# Patient Record
Sex: Female | Born: 1960 | Race: White | Hispanic: No | Marital: Single | Smoking: Never smoker
Health system: Southern US, Community
[De-identification: ages and names within clinical notes are randomized; demographics above are authoritative.]

## PROBLEM LIST (undated history)

## (undated) DIAGNOSIS — G43909 Migraine, unspecified, not intractable, without status migrainosus: Secondary | ICD-10-CM

## (undated) DIAGNOSIS — E785 Hyperlipidemia, unspecified: Secondary | ICD-10-CM

## (undated) DIAGNOSIS — Z91148 Patient's other noncompliance with medication regimen for other reason: Secondary | ICD-10-CM

## (undated) DIAGNOSIS — F32A Depression, unspecified: Secondary | ICD-10-CM

## (undated) DIAGNOSIS — F419 Anxiety disorder, unspecified: Secondary | ICD-10-CM

## (undated) DIAGNOSIS — F329 Major depressive disorder, single episode, unspecified: Secondary | ICD-10-CM

## (undated) DIAGNOSIS — Z9114 Patient's other noncompliance with medication regimen: Secondary | ICD-10-CM

## (undated) DIAGNOSIS — K219 Gastro-esophageal reflux disease without esophagitis: Secondary | ICD-10-CM

---

## 1995-03-07 HISTORY — PX: TUBAL LIGATION: SHX77

## 2002-03-06 HISTORY — PX: AUGMENTATION MAMMAPLASTY: SUR837

## 2006-05-07 ENCOUNTER — Ambulatory Visit: Payer: Self-pay | Admitting: Gastroenterology

## 2013-06-23 DIAGNOSIS — F4323 Adjustment disorder with mixed anxiety and depressed mood: Secondary | ICD-10-CM | POA: Insufficient documentation

## 2013-06-23 DIAGNOSIS — G43909 Migraine, unspecified, not intractable, without status migrainosus: Secondary | ICD-10-CM | POA: Insufficient documentation

## 2014-07-03 ENCOUNTER — Ambulatory Visit
Admit: 2014-07-03 | Disposition: A | Discharge status: Home / Self Care | Payer: Self-pay | Attending: Surgery | Admitting: Surgery

## 2015-06-17 DIAGNOSIS — E782 Mixed hyperlipidemia: Secondary | ICD-10-CM | POA: Insufficient documentation

## 2015-12-22 ENCOUNTER — Other Ambulatory Visit: Payer: Self-pay | Admitting: Internal Medicine

## 2015-12-22 DIAGNOSIS — N644 Mastodynia: Secondary | ICD-10-CM

## 2015-12-28 ENCOUNTER — Other Ambulatory Visit: Payer: Self-pay | Admitting: *Deleted

## 2015-12-28 ENCOUNTER — Ambulatory Visit
Admission: RE | Admit: 2015-12-28 | Discharge: 2015-12-28 | Disposition: A | Discharge status: Home / Self Care | Payer: Self-pay | Source: Ambulatory Visit | Attending: *Deleted | Admitting: *Deleted

## 2015-12-28 DIAGNOSIS — Z9289 Personal history of other medical treatment: Secondary | ICD-10-CM

## 2016-01-14 ENCOUNTER — Ambulatory Visit
Admission: RE | Admit: 2016-01-14 | Discharge: 2016-01-14 | Disposition: A | Discharge status: Home / Self Care | Payer: Managed Care, Other (non HMO) | Source: Ambulatory Visit | Attending: Internal Medicine | Admitting: Internal Medicine

## 2016-01-14 ENCOUNTER — Other Ambulatory Visit: Payer: Self-pay | Admitting: Internal Medicine

## 2016-01-14 DIAGNOSIS — Z9882 Breast implant status: Secondary | ICD-10-CM | POA: Diagnosis not present

## 2016-01-14 DIAGNOSIS — N644 Mastodynia: Secondary | ICD-10-CM | POA: Insufficient documentation

## 2016-01-14 DIAGNOSIS — R921 Mammographic calcification found on diagnostic imaging of breast: Secondary | ICD-10-CM | POA: Diagnosis not present

## 2016-01-18 ENCOUNTER — Other Ambulatory Visit: Payer: Self-pay | Admitting: Internal Medicine

## 2016-01-18 DIAGNOSIS — R921 Mammographic calcification found on diagnostic imaging of breast: Secondary | ICD-10-CM

## 2016-02-01 ENCOUNTER — Ambulatory Visit
Admission: RE | Admit: 2016-02-01 | Discharge: 2016-02-01 | Disposition: A | Discharge status: Home / Self Care | Payer: Managed Care, Other (non HMO) | Source: Ambulatory Visit | Attending: Internal Medicine | Admitting: Internal Medicine

## 2016-02-01 DIAGNOSIS — N6011 Diffuse cystic mastopathy of right breast: Secondary | ICD-10-CM | POA: Insufficient documentation

## 2016-02-01 DIAGNOSIS — R920 Mammographic microcalcification found on diagnostic imaging of breast: Secondary | ICD-10-CM | POA: Insufficient documentation

## 2016-02-01 DIAGNOSIS — R921 Mammographic calcification found on diagnostic imaging of breast: Secondary | ICD-10-CM

## 2016-02-02 LAB — SURGICAL PATHOLOGY

## 2016-02-04 HISTORY — PX: BREAST CYST EXCISION: SHX579

## 2016-12-22 DIAGNOSIS — E538 Deficiency of other specified B group vitamins: Secondary | ICD-10-CM | POA: Insufficient documentation

## 2017-01-11 ENCOUNTER — Other Ambulatory Visit: Payer: Self-pay | Admitting: Internal Medicine

## 2017-01-11 DIAGNOSIS — Z1231 Encounter for screening mammogram for malignant neoplasm of breast: Secondary | ICD-10-CM

## 2017-02-01 ENCOUNTER — Ambulatory Visit
Admission: RE | Admit: 2017-02-01 | Discharge: 2017-02-01 | Disposition: A | Discharge status: Home / Self Care | Payer: 59 | Source: Ambulatory Visit | Attending: Internal Medicine | Admitting: Internal Medicine

## 2017-02-01 DIAGNOSIS — Z1231 Encounter for screening mammogram for malignant neoplasm of breast: Secondary | ICD-10-CM | POA: Insufficient documentation

## 2017-04-03 ENCOUNTER — Emergency Department
Admission: EM | Admit: 2017-04-03 | Discharge: 2017-04-03 | Discharge status: Against Medical Advice | Payer: 59 | Attending: Emergency Medicine | Admitting: Emergency Medicine

## 2017-04-03 ENCOUNTER — Encounter: Payer: Self-pay | Admitting: Emergency Medicine

## 2017-04-03 ENCOUNTER — Emergency Department: Payer: 59

## 2017-04-03 ENCOUNTER — Other Ambulatory Visit: Payer: 59

## 2017-04-03 ENCOUNTER — Other Ambulatory Visit: Payer: Self-pay

## 2017-04-03 DIAGNOSIS — R270 Ataxia, unspecified: Secondary | ICD-10-CM | POA: Diagnosis not present

## 2017-04-03 DIAGNOSIS — R400 Somnolence: Secondary | ICD-10-CM | POA: Insufficient documentation

## 2017-04-03 DIAGNOSIS — R410 Disorientation, unspecified: Secondary | ICD-10-CM

## 2017-04-03 HISTORY — DX: Depression, unspecified: F32.A

## 2017-04-03 HISTORY — DX: Major depressive disorder, single episode, unspecified: F32.9

## 2017-04-03 LAB — COMPREHENSIVE METABOLIC PANEL
ALBUMIN: 4.6 g/dL (ref 3.5–5.0)
ALT: 16 U/L (ref 14–54)
ANION GAP: 8 (ref 5–15)
AST: 22 U/L (ref 15–41)
Alkaline Phosphatase: 62 U/L (ref 38–126)
BUN: 11 mg/dL (ref 6–20)
CO2: 28 mmol/L (ref 22–32)
Calcium: 9.1 mg/dL (ref 8.9–10.3)
Chloride: 103 mmol/L (ref 101–111)
Creatinine, Ser: 0.88 mg/dL (ref 0.44–1.00)
GFR calc Af Amer: >60 mL/min (ref >60)
GLUCOSE: 115 mg/dL — AB (ref 65–99)
POTASSIUM: 3.6 mmol/L (ref 3.5–5.1)
Sodium: 139 mmol/L (ref 135–145)
TOTAL PROTEIN: 7.3 g/dL (ref 6.5–8.1)
Total Bilirubin: 0.7 mg/dL (ref 0.3–1.2)

## 2017-04-03 LAB — CBC WITH DIFFERENTIAL/PLATELET
BASOS PCT: 1 %
Basophils Absolute: 0 10*3/uL (ref 0–0.1)
EOS PCT: 3 %
Eosinophils Absolute: 0.1 10*3/uL (ref 0–0.7)
HCT: 39.7 % (ref 35.0–47.0)
Hemoglobin: 13.4 g/dL (ref 12.0–16.0)
Lymphocytes Relative: 26 %
Lymphs Abs: 1.1 10*3/uL (ref 1.0–3.6)
MCH: 29.2 pg (ref 26.0–34.0)
MCHC: 33.6 g/dL (ref 32.0–36.0)
MCV: 86.9 fL (ref 80.0–100.0)
MONO ABS: 0.3 10*3/uL (ref 0.2–0.9)
Monocytes Relative: 6 %
NEUTROS ABS: 2.8 10*3/uL (ref 1.4–6.5)
Neutrophils Relative %: 64 %
Platelets: 179 10*3/uL (ref 150–440)
RBC: 4.57 MIL/uL (ref 3.80–5.20)
RDW: 13.4 % (ref 11.5–14.5)
WBC: 4.4 10*3/uL (ref 3.6–11.0)

## 2017-04-03 LAB — TROPONIN I

## 2017-04-03 LAB — ETHANOL: Alcohol, Ethyl (B): <10 mg/dL (ref <10)

## 2017-04-03 NOTE — ED Notes (Signed)
Pt signed hardcopy of AMA and it is in pt chart.

## 2017-04-03 NOTE — ED Notes (Signed)
FN: pt sent over from [facility] for further eval of headache from a fall last week.

## 2017-04-03 NOTE — ED Notes (Signed)
FN: pt called to go back to a hallway bed and did not answer.
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
## 2017-12-27 ENCOUNTER — Other Ambulatory Visit: Payer: Self-pay | Admitting: Internal Medicine

## 2017-12-27 DIAGNOSIS — Z1231 Encounter for screening mammogram for malignant neoplasm of breast: Secondary | ICD-10-CM

## 2018-02-05 ENCOUNTER — Ambulatory Visit
Admission: RE | Admit: 2018-02-05 | Discharge: 2018-02-05 | Disposition: A | Discharge status: Home / Self Care | Payer: Managed Care, Other (non HMO) | Source: Ambulatory Visit | Attending: Internal Medicine | Admitting: Internal Medicine

## 2018-02-05 DIAGNOSIS — Z1231 Encounter for screening mammogram for malignant neoplasm of breast: Secondary | ICD-10-CM | POA: Diagnosis present

## 2018-08-31 ENCOUNTER — Inpatient Hospital Stay
Admission: EM | Admit: 2018-08-31 | Discharge: 2018-09-03 | DRG: 917 | Disposition: A | Discharge status: Other Acute Inpatient Hospital | Payer: Managed Care, Other (non HMO) | Attending: Internal Medicine | Admitting: Internal Medicine

## 2018-08-31 ENCOUNTER — Encounter: Payer: Self-pay | Admitting: Emergency Medicine

## 2018-08-31 ENCOUNTER — Other Ambulatory Visit: Payer: Self-pay

## 2018-08-31 DIAGNOSIS — G4700 Insomnia, unspecified: Secondary | ICD-10-CM | POA: Diagnosis present

## 2018-08-31 DIAGNOSIS — T426X2A Poisoning by other antiepileptic and sedative-hypnotic drugs, intentional self-harm, initial encounter: Principal | ICD-10-CM | POA: Diagnosis present

## 2018-08-31 DIAGNOSIS — Z79899 Other long term (current) drug therapy: Secondary | ICD-10-CM

## 2018-08-31 DIAGNOSIS — G92 Toxic encephalopathy: Secondary | ICD-10-CM | POA: Diagnosis present

## 2018-08-31 DIAGNOSIS — T50902A Poisoning by unspecified drugs, medicaments and biological substances, intentional self-harm, initial encounter: Secondary | ICD-10-CM

## 2018-08-31 DIAGNOSIS — E876 Hypokalemia: Secondary | ICD-10-CM | POA: Diagnosis present

## 2018-08-31 DIAGNOSIS — T50901A Poisoning by unspecified drugs, medicaments and biological substances, accidental (unintentional), initial encounter: Secondary | ICD-10-CM | POA: Diagnosis present

## 2018-08-31 DIAGNOSIS — R9431 Abnormal electrocardiogram [ECG] [EKG]: Secondary | ICD-10-CM

## 2018-08-31 DIAGNOSIS — R45851 Suicidal ideations: Secondary | ICD-10-CM | POA: Diagnosis present

## 2018-08-31 DIAGNOSIS — T39392A Poisoning by other nonsteroidal anti-inflammatory drugs [NSAID], intentional self-harm, initial encounter: Secondary | ICD-10-CM | POA: Diagnosis present

## 2018-08-31 DIAGNOSIS — R4182 Altered mental status, unspecified: Secondary | ICD-10-CM | POA: Diagnosis not present

## 2018-08-31 DIAGNOSIS — Z1159 Encounter for screening for other viral diseases: Secondary | ICD-10-CM | POA: Diagnosis not present

## 2018-08-31 DIAGNOSIS — T43212A Poisoning by selective serotonin and norepinephrine reuptake inhibitors, intentional self-harm, initial encounter: Secondary | ICD-10-CM | POA: Diagnosis present

## 2018-08-31 DIAGNOSIS — F332 Major depressive disorder, recurrent severe without psychotic features: Secondary | ICD-10-CM | POA: Diagnosis present

## 2018-08-31 DIAGNOSIS — T391X2A Poisoning by 4-Aminophenol derivatives, intentional self-harm, initial encounter: Secondary | ICD-10-CM | POA: Diagnosis present

## 2018-08-31 HISTORY — DX: Suicidal ideations: R45.851

## 2018-08-31 LAB — PROTIME-INR
INR: 0.9 (ref 0.8–1.2)
Prothrombin Time: 12.3 seconds (ref 11.4–15.2)

## 2018-08-31 LAB — COMPREHENSIVE METABOLIC PANEL
ALT: 80 U/L — ABNORMAL HIGH (ref 0–44)
AST: 94 U/L — ABNORMAL HIGH (ref 15–41)
Albumin: 3.9 g/dL (ref 3.5–5.0)
Alkaline Phosphatase: 72 U/L (ref 38–126)
Anion gap: 9 (ref 5–15)
BUN: 14 mg/dL (ref 6–20)
CO2: 23 mmol/L (ref 22–32)
Calcium: 8.9 mg/dL (ref 8.9–10.3)
Chloride: 106 mmol/L (ref 98–111)
Creatinine, Ser: 0.91 mg/dL (ref 0.44–1.00)
GFR calc Af Amer: >60 mL/min (ref >60)
GFR calc non Af Amer: >60 mL/min (ref >60)
Glucose, Bld: 109 mg/dL — ABNORMAL HIGH (ref 70–99)
Potassium: 3.4 mmol/L — ABNORMAL LOW (ref 3.5–5.1)
Sodium: 138 mmol/L (ref 135–145)
Total Bilirubin: 1.7 mg/dL — ABNORMAL HIGH (ref 0.3–1.2)
Total Protein: 6.8 g/dL (ref 6.5–8.1)

## 2018-08-31 LAB — CBC
HCT: 37.4 % (ref 36.0–46.0)
Hemoglobin: 12.3 g/dL (ref 12.0–15.0)
MCH: 29.5 pg (ref 26.0–34.0)
MCHC: 32.9 g/dL (ref 30.0–36.0)
MCV: 89.7 fL (ref 80.0–100.0)
Platelets: 173 10*3/uL (ref 150–400)
RBC: 4.17 MIL/uL (ref 3.87–5.11)
RDW: 13.3 % (ref 11.5–15.5)
WBC: 3.9 10*3/uL — ABNORMAL LOW (ref 4.0–10.5)
nRBC: 0 % (ref 0.0–0.2)

## 2018-08-31 LAB — ACETAMINOPHEN LEVEL
Acetaminophen (Tylenol), Serum: 75 ug/mL — ABNORMAL HIGH (ref 10–30)
Acetaminophen (Tylenol), Serum: 56 ug/mL — ABNORMAL HIGH (ref 10–30)

## 2018-08-31 LAB — URINE DRUG SCREEN, QUALITATIVE (ARMC ONLY)
Amphetamines, Ur Screen: NOT DETECTED
Barbiturates, Ur Screen: NOT DETECTED
Benzodiazepine, Ur Scrn: NOT DETECTED
Cannabinoid 50 Ng, Ur ~~LOC~~: NOT DETECTED
Cocaine Metabolite,Ur ~~LOC~~: NOT DETECTED
MDMA (Ecstasy)Ur Screen: NOT DETECTED
Methadone Scn, Ur: NOT DETECTED
Opiate, Ur Screen: NOT DETECTED
Phencyclidine (PCP) Ur S: NOT DETECTED
Tricyclic, Ur Screen: NOT DETECTED

## 2018-08-31 LAB — MAGNESIUM: Magnesium: 2.5 mg/dL — ABNORMAL HIGH (ref 1.7–2.4)

## 2018-08-31 LAB — SALICYLATE LEVEL: Salicylate Lvl: <7 mg/dL (ref 2.8–30.0)

## 2018-08-31 LAB — SARS CORONAVIRUS 2 BY RT PCR (HOSPITAL ORDER, PERFORMED IN ~~LOC~~ HOSPITAL LAB): SARS Coronavirus 2: NEGATIVE

## 2018-08-31 LAB — ETHANOL: Alcohol, Ethyl (B): <10 mg/dL (ref <10)

## 2018-08-31 MED ORDER — MAGNESIUM SULFATE 2 GM/50ML IV SOLN: 2.0000 g | Freq: Once | INTRAVENOUS | Status: DC

## 2018-08-31 MED ORDER — POTASSIUM CHLORIDE CRYS ER 20 MEQ PO TBCR
40.0000 meq | EXTENDED_RELEASE_TABLET | Freq: Once | ORAL | Status: AC
  Administered 2018-08-31: 40 meq via ORAL
  Filled 2018-08-31: qty 2

## 2018-08-31 MED ORDER — BISACODYL 5 MG PO TBEC: 5.0000 mg | DELAYED_RELEASE_TABLET | Freq: Every day | ORAL | Status: DC | PRN

## 2018-08-31 MED ORDER — ENOXAPARIN SODIUM 40 MG/0.4ML ~~LOC~~ SOLN
40.0000 mg | SUBCUTANEOUS | Status: DC
  Administered 2018-08-31 – 2018-09-02 (×3): 40 mg via SUBCUTANEOUS
  Filled 2018-08-31 (×3): qty 0.4

## 2018-08-31 MED ORDER — SENNOSIDES-DOCUSATE SODIUM 8.6-50 MG PO TABS: 1.0000 | ORAL_TABLET | Freq: Every evening | ORAL | Status: DC | PRN

## 2018-08-31 MED ORDER — SODIUM CHLORIDE 0.9 % IV BOLUS
500.0000 mL | Freq: Once | INTRAVENOUS | Status: AC
  Administered 2018-08-31: 500 mL via INTRAVENOUS

## 2018-08-31 MED ORDER — SODIUM CHLORIDE 0.9 % IV SOLN
Freq: Once | INTRAVENOUS | Status: AC
  Administered 2018-08-31: 15:00:00 via INTRAVENOUS

## 2018-08-31 MED ORDER — ACETAMINOPHEN 650 MG RE SUPP: 650.0000 mg | Freq: Four times a day (QID) | RECTAL | Status: DC | PRN

## 2018-08-31 MED ORDER — POTASSIUM CHLORIDE IN NACL 20-0.9 MEQ/L-% IV SOLN
INTRAVENOUS | Status: DC
  Administered 2018-08-31 – 2018-09-01 (×2): via INTRAVENOUS
  Filled 2018-08-31 (×6): qty 1000

## 2018-08-31 MED ORDER — ACETAMINOPHEN 325 MG PO TABS: 650.0000 mg | ORAL_TABLET | Freq: Four times a day (QID) | ORAL | Status: DC | PRN

## 2018-08-31 NOTE — Progress Notes (Signed)
Patient very drowsy, let her know I would come see her tomorrow.  Too drowsy to assess at this time, will see her tomorrow on the floor.  Waylan Boga, PMHNP

## 2018-08-31 NOTE — ED Notes (Signed)
Pt states overdosed and wanted to hurt herself d/t being depressed over her son. Pt is speaking very softly but said something about her son wanting nothing to do with her.

## 2018-08-31 NOTE — ED Triage Notes (Signed)
Pt to ER via EMS with reports of possible overdose.  Family found pt mildly unresponsive, has hx of drug overdose.  Pt possibly took 3 effexor, 10 ambien, 6 valtrex, and 15 etodolac.  Pt admitted purposeful intent to EMS.

## 2018-08-31 NOTE — ED Notes (Signed)
Pt ivc'd with officer at the doorway. Pt was changed into scrubs on arrival. Pt had on shorts and tshirt - no undergarments. Had on two rings with are in her clothing bag. Cell phone and empty medication bottles are in the bag.

## 2018-08-31 NOTE — ED Notes (Signed)
Patient is resting comfortably. 

## 2018-08-31 NOTE — ED Notes (Signed)
Pt is on an external urinary catheter - no urine at this time. Fluids are infusing

## 2018-08-31 NOTE — Progress Notes (Signed)
   08/31/18 1800  Clinical Encounter Type  Visited With Patient not available;Other (Comment)  Visit Type Initial  Referral From Nurse  Consult/Referral To Chaplain  Spiritual Encounters  Spiritual Needs Other (Comment)  Chaplain received OR for AD. Chaplain went to visit patient was asleep nurse stated patient was to drowsy at this time. Chaplain will tell next Chaplain on call to follow up.

## 2018-08-31 NOTE — H&P (Signed)
[facility] [place] [facility]   PATIENT NAME: Ann GillsCathy Jimenez    MR#:  [id number]  DATE OF BIRTH:  [birth year]  DATE OF ADMISSION:  08/31/2018  PRIMARY CARE PHYSICIAN: Danella PentonMiller, Mark F, MD   REQUESTING/REFERRING PHYSICIAN: Dr. Roxan Hockeyobinson.  CHIEF COMPLAINT:   Chief Complaint  Patient presents with  . Drug Overdose   Drug overdose. HISTORY OF PRESENT ILLNESS:  Ann Jimenez  is a 58 y.o. female with a known history of depression.  The patient is sent to the ED due to intentional drug overdose.  The patient attempted to suicide and ingested 3 Effexor, 10 Ambien, 6 Valtrex and 15 etodolac.  She is drowsy and not a good historian. PAST MEDICAL HISTORY:   Past Medical History:  Diagnosis Date  . Depression     PAST SURGICAL HISTORY:   Past Surgical History:  Procedure Laterality Date  . AUGMENTATION MAMMAPLASTY  2004  . BREAST CYST EXCISION  02/2016    SOCIAL HISTORY:   Social History   Tobacco Use  . Smoking status: Never Smoker  . Smokeless tobacco: Never Used  Substance Use Topics  . Alcohol use: No    Frequency: Never    FAMILY HISTORY:   Family History  Problem Relation Age of Onset  . Breast cancer Mother 4480    DRUG ALLERGIES:  No Known Allergies  REVIEW OF SYSTEMS:   Review of Systems  Unable to perform ROS: Mental status change    MEDICATIONS AT HOME:   Prior to Admission medications   Medication Sig Start Date End Date Taking? Authorizing Provider  cyclobenzaprine (FLEXERIL) 10 MG tablet Take 10 mg by mouth 2 (two) times daily as needed for muscle spasms. 06/18/18  Yes [provider]  etodolac (LODINE) 400 MG tablet Take 400 mg by mouth 2 (two) times daily as needed for pain. 06/21/18  Yes [provider]  venlafaxine XR (EFFEXOR-XR) 75 MG 24 hr capsule Take 75 mg by mouth daily. 08/06/18  Yes [provider]  zolpidem (AMBIEN) 10 MG tablet Take 10 mg by mouth at bedtime. 08/06/18  Yes [provider]      VITAL SIGNS:  Blood pressure 102/61, pulse 61, temperature 97.8 F (36.6 C), temperature source Oral, resp. rate 20, SpO2 98 %.  PHYSICAL EXAMINATION:  Physical Exam  GENERAL:  58 y.o.-year-old patient lying in the bed with no acute distress.  EYES: Pupils equal, round, reactive to light and accommodation. No scleral icterus. Extraocular muscles intact.  HEENT: Head atraumatic, normocephalic. NECK:  Supple, no jugular venous distention. No thyroid enlargement, no tenderness.  LUNGS: Normal breath sounds bilaterally, no wheezing, rales,rhonchi or crepitation. No use of accessory muscles of respiration.  CARDIOVASCULAR: S1, S2 normal. No murmurs, rubs, or gallops.  ABDOMEN: Soft, nontender, nondistended. Bowel sounds present. No organomegaly or mass.  EXTREMITIES: No pedal edema, cyanosis, or clubbing.  NEUROLOGIC: Unable to exam. PSYCHIATRIC: The patient is drowsy and lethargy. SKIN: No obvious rash, lesion, or ulcer.  Drowsy. LABORATORY PANEL:   CBC Recent Labs  Lab 08/31/18 1315  WBC 3.9*  HGB 12.3  HCT 37.4  PLT 173   ------------------------------------------------------------------------------------------------------------------  Chemistries  Recent Labs  Lab 08/31/18 1315  NA 138  K 3.4*  CL 106  CO2 23  GLUCOSE 109*  BUN 14  CREATININE 0.91  CALCIUM 8.9  MG 2.5*  AST 94*  ALT 80*  ALKPHOS 72  BILITOT 1.7*   ------------------------------------------------------------------------------------------------------------------  Cardiac Enzymes No results for  input(s): TROPONINI in the last 168 hours. ------------------------------------------------------------------------------------------------------------------  RADIOLOGY:  No results found.    IMPRESSION AND PLAN:   Drug overdose The patient will be admitted to medical floor. IV fluid support, hold psych medication.  Acute metabolic encephalopathy due to above.  Aspiration  precaution.  Suicidal ideation and depression.  Follow-up psych consult, continue IVC. Hypokalemia.  Potassium supplement. Abnormal liver function test, possible due to drug overdose.  Follow-up CMP.  All the records are reviewed and case discussed with ED provider. Management plans discussed with the patient, family and they are in agreement.  CODE STATUS: Full code.  TOTAL TIME TAKING CARE OF THIS PATIENT: 45 minutes.    Demetrios Loll M.D on 08/31/2018 at 4:11 PM  Between 7am to 6pm - Pager - [phone]  After 6pm go to [email or web address] - Proofreader  [facility]  Office  [phone]  CC: Primary care physician; Rusty Aus, MD   Note: This dictation was prepared with Dragon dictation along with smaller phrase technology. Any transcriptional errors that result from this process are unin

## 2018-08-31 NOTE — ED Notes (Signed)
ED TO INPATIENT HANDOFF REPORT  ED Nurse Name and Phone #: Duante Arocho [phone]  S Name/Age/Gender Ann Jimenez 58 y.o. female Room/Bed: ED07A/ED07A  Code Status   Code Status: Full Code  Home/SNF/Other home {Patient oriented x 4  Is this baseline? yes  Triage Complete: Triage complete  Chief Complaint Overdose  Triage Note Pt to ER via EMS with reports of possible overdose.  Family found pt mildly unresponsive, has hx of drug overdose.  Pt possibly took 3 effexor, 10 ambien, 6 valtrex, and 15 etodolac.  Pt admitted purposeful intent to EMS.   Allergies No Known Allergies  Level of Care/Admitting Diagnosis ED Disposition    ED Disposition Condition Comment   Admit  Hospital Area: [facility] [[id number]]  Level of Care: Med-Surg [16]  Covid Evaluation: Confirmed COVID Negative  Diagnosis: Drug overdose [202575]  Admitting Physician: Shaune PollackCHEN, QING [[id number]]  Attending Physician: Shaune PollackCHEN, QING [[id number]]  Estimated length of stay: 3 - 4 days  Certification:: I certify this patient will need inpatient services for at least 2 midnights  PT Class (Do Not Modify): Inpatient [101]  PT Acc Code (Do Not Modify): Private [1]       B Medical/Surgery History Past Medical History:  Diagnosis Date  . Depression    Past Surgical History:  Procedure Laterality Date  . AUGMENTATION MAMMAPLASTY  2004  . BREAST CYST EXCISION  02/2016     A IV Location/Drains/Wounds Patient Lines/Drains/Airways Status   Active Line/Drains/Airways    Name:   Placement date:   Placement time:   Site:   Days:   Peripheral IV 08/31/18 Right Antecubital   08/31/18    1314    Antecubital   less than 1          Intake/Output Last 24 hours No intake or output data in the 24 hours ending 08/31/18 1642  Labs/Imaging Results for orders placed or performed during the hospital encounter of 08/31/18 (from the past 48 hour(s))  Comprehensive metabolic panel     Status: Abnormal   Collection  Time: 08/31/18  1:15 PM  Result Value Ref Range   Sodium 138 135 - 145 mmol/L   Potassium 3.4 (L) 3.5 - 5.1 mmol/L   Chloride 106 98 - 111 mmol/L   CO2 23 22 - 32 mmol/L   Glucose, Bld 109 (H) 70 - 99 mg/dL   BUN 14 6 - 20 mg/dL   Creatinine, Ser 3.240.91 0.44 - 1.00 mg/dL   Calcium 8.9 8.9 - 40.110.3 mg/dL   Total Protein 6.8 6.5 - 8.1 g/dL   Albumin 3.9 3.5 - 5.0 g/dL   AST 94 (H) 15 - 41 U/L   ALT 80 (H) 0 - 44 U/L   Alkaline Phosphatase 72 38 - 126 U/L   Total Bilirubin 1.7 (H) 0.3 - 1.2 mg/dL   GFR calc non Af Amer >60 >60 mL/min   GFR calc Af Amer >60 >60 mL/min   Anion gap 9 5 - 15    Comment: Performed at [facility], [address] [place], [place] [address]  Ethanol     Status: None   Collection Time: 08/31/18  1:15 PM  Result Value Ref Range   Alcohol, Ethyl (B) <10 <10 mg/dL    Comment: (NOTE) Lowest detectable limit for serum alcohol is 10 mg/dL. For medical purposes only. Performed at [facility], [address] [place], [place] [address]   Salicylate level     Status: None  Collection Time: 08/31/18  1:15 PM  Result Value Ref Range   Salicylate Lvl <7.0 2.8 - 30.0 mg/dL    Comment: Performed at [facility], [address] [place], [place] [address]  Acetaminophen level     Status: Abnormal   Collection Time: 08/31/18  1:15 PM  Result Value Ref Range   Acetaminophen (Tylenol), Serum 75 (H) 10 - 30 ug/mL    Comment: (NOTE) Therapeutic concentrations vary significantly. A range of 10-30 ug/mL  may be an effective concentration for many patients. However, some  are best treated at concentrations outside of this range. Acetaminophen concentrations >150 ug/mL at 4 hours after ingestion  and >50 ug/mL at 12 hours after ingestion are often associated with  toxic reactions. Performed at [facility], [address] [place], [place] [address]   cbc     Status: Abnormal   Collection Time: 08/31/18  1:15 PM  Result Value  Ref Range   WBC 3.9 (L) 4.0 - 10.5 K/uL   RBC 4.17 3.87 - 5.11 MIL/uL   Hemoglobin 12.3 12.0 - 15.0 g/dL   HCT 84.637.4 96.236.0 - 95.246.0 %   MCV 89.7 80.0 - 100.0 fL   MCH 29.5 26.0 - 34.0 pg   MCHC 32.9 30.0 - 36.0 g/dL   RDW 84.113.3 32.411.5 - 40.115.5 %   Platelets 173 150 - 400 K/uL   nRBC 0.0 0.0 - 0.2 %    Comment: Performed at [facility], [address] [place], [place] [address]  Magnesium     Status: Abnormal   Collection Time: 08/31/18  1:15 PM  Result Value Ref Range   Magnesium 2.5 (H) 1.7 - 2.4 mg/dL    Comment: Performed at [facility], [address] [place], [place] [address]  SARS Coronavirus 2 (CEPHEID - Performed in [facility] hospital lab), Hosp Order     Status: None   Collection Time: 08/31/18  1:15 PM   Specimen: Nasopharyngeal Swab  Result Value Ref Range   SARS Coronavirus 2 NEGATIVE NEGATIVE    Comment: (NOTE) If result is NEGATIVE SARS-CoV-2 target nucleic acids are NOT DETECTED. The SARS-CoV-2 RNA is generally detectable in upper and lower  respiratory specimens during the acute phase of infection. The lowest  concentration of SARS-CoV-2 viral copies this assay can detect is 250  copies / mL. A negative result does not preclude SARS-CoV-2 infection  and should not be used as the sole basis for treatment or other  patient management decisions.  A negative result may occur with  improper specimen collection / handling, submission of specimen other  than nasopharyngeal swab, presence of viral mutation(s) within the  areas targeted by this assay, and inadequate number of viral copies  (<250 copies / mL). A negative result must be combined with clinical  observations, patient history, and epidemiological information. If result is POSITIVE SARS-CoV-2 target nucleic acids are DETECTED. The SARS-CoV-2 RNA is generally detectable in upper and lower  respiratory specimens dur ing the acute phase of infection.  Positive  results are indicative of active  infection with SARS-CoV-2.  Clinical  correlation with patient history and other diagnostic information is  necessary to determine patient infection status.  Positive results do  not rule out bacterial infection or co-infection with other viruses. If result is PRESUMPTIVE POSTIVE SARS-CoV-2 nucleic acids MAY BE PRESENT.   A presumptive positive result was obtained on the submitted specimen  and confirmed on repeat testing.  While 2019 novel coronavirus  (  SARS-CoV-2) nucleic acids may be present in the submitted sample  additional confirmatory testing may be necessary for epidemiological  and / or clinical management purposes  to differentiate between  SARS-CoV-2 and other Sarbecovirus currently known to infect humans.  If clinically indicated additional testing with an alternate test  methodology [phone]) is advised. The SARS-CoV-2 RNA is generally  detectable in upper and lower respiratory sp ecimens during the acute  phase of infection. The expected result is Negative. Fact Sheet for Patients:  StrictlyIdeas.no Fact Sheet for Healthcare Providers: BankingDealers.co.za This test is not yet approved or cleared by the [place] FDA and has been authorized for detection and/or diagnosis of SARS-CoV-2 by FDA under an Emergency Use Authorization (EUA).  This EUA will remain in effect (meaning this test can be used) for the duration of the COVID-19 declaration under Section 564(b)(1) of the Act, 21 U.S.C. section 360bbb-3(b)(1), unless the authorization is terminated or revoked sooner. Performed at [facility], [address] [place], [place] [address]   Protime-INR     Status: None   Collection Time: 08/31/18  2:34 PM  Result Value Ref Range   Prothrombin Time 12.3 11.4 - 15.2 seconds   INR 0.9 0.8 - 1.2    Comment: (NOTE) INR goal varies based on device and disease states. Performed at [facility], [address] [place], [place] [address]    No results found.  Pending Labs Unresulted Labs (From admission, onward)    Start     Ordered   09/07/18 0500  Creatinine, serum  (enoxaparin (LOVENOX)    CrCl >/= 30 ml/min)  Weekly,   STAT    Comments: while on enoxaparin therapy    08/31/18 1641   09/01/18 3086  Basic metabolic panel  Tomorrow morning,   STAT     08/31/18 1641   09/01/18 0500  CBC  Tomorrow morning,   STAT     08/31/18 1641   08/31/18 1700  Acetaminophen level  Once-Timed,   STAT     08/31/18 1420   08/31/18 1641  HIV antibody (Routine Testing)  Once,   STAT     08/31/18 1641   08/31/18 1304  Urine Drug Screen, Qualitative  Once,   STAT     08/31/18 1304          Vitals/Pain Today's Vitals   08/31/18 1430 08/31/18 1447 08/31/18 1500 08/31/18 1630  BP: (!) 91/54  102/61 (!) 107/59  Pulse:   61   Resp: 16  20 16   Temp:      TempSrc:      SpO2:   98%   PainSc:  0-No pain      Isolation Precautions No active isolations  Medications Medications  acetaminophen (TYLENOL) tablet 650 mg (has no administration in time range)    Or  acetaminophen (TYLENOL) suppository 650 mg (has no administration in time range)  enoxaparin (LOVENOX) injection 40 mg (has no administration in time range)  0.9 % NaCl with KCl 20 mEq/ L  infusion (has no administration in time range)  bisacodyl (DULCOLAX) EC tablet 5 mg (has no administration in time range)  senna-docusate (Senokot-S) tablet 1 tablet (has no administration in time range)  0.9 %  sodium chloride infusion ( Intravenous Stopped 08/31/18 1552)  potassium chloride SA (K-DUR) CR tablet 40 mEq (40 mEq Oral Given 08/31/18 1540)  sodium chloride 0.9 % bolus 500 mL (500 mLs Intravenous New Bag/Given 08/31/18 1543)    Mobility High  fall risk   Focused Assessments  R Recommendations: See Admitting Provider Note  Report given to:   Additional Notes:

## 2018-08-31 NOTE — ED Provider Notes (Signed)
[facility] Emergency Department Provider Note    None    (approximate)  I have reviewed the triage vital signs and the nursing notes.   HISTORY Chief Complaint Drug Overdose  Level V Caveat:  aMS intentional OD  HPI Ann Jimenez is a 58 y.o. female who presents to the ER for intentional overdose.  Patient ingested reportedly 3 Effexor, 10 Ambien, 6 Valtrex and 15 etodolac.  She denies any Tylenol ingestion.  States it was attempted suicide.  Patient drowsy but protecting her airway.  Very unreliable historian.  Does not want to cooperate with exam.    Past Medical History:  Diagnosis Date  . Depression    Family History  Problem Relation Age of Onset  . Breast cancer Mother [id number]   Past Surgical History:  Procedure Laterality Date  . AUGMENTATION MAMMAPLASTY  2004  . BREAST CYST EXCISION  02/2016   There are no active problems to display for this patient.     Prior to Admission medications   Medication Sig Start Date End Date Taking? Authorizing Provider  cyclobenzaprine (FLEXERIL) 10 MG tablet Take 10 mg by mouth 2 (two) times daily as needed for muscle spasms. 06/18/18  Yes [provider]  etodolac (LODINE) 400 MG tablet Take 400 mg by mouth 2 (two) times daily as needed for pain. 06/21/18  Yes [provider]  venlafaxine XR (EFFEXOR-XR) 75 MG 24 hr capsule Take 75 mg by mouth daily. 08/06/18  Yes [provider]  zolpidem (AMBIEN) 10 MG tablet Take 10 mg by mouth at bedtime. 08/06/18  Yes [provider]    Allergies Patient has no known allergies.    Social History Social History   Tobacco Use  . Smoking status: Never Smoker  . Smokeless tobacco: Never Used  Substance Use Topics  . Alcohol use: No    Frequency: Never  . Drug use: No    Review of Systems Patient denies headaches, rhinorrhea, blurry vision, numbness, shortness of breath, chest pain, edema, cough, abdominal pain, nausea,  vomiting, diarrhea, dysuria, fevers, rashes or hallucinations unless otherwise stated above in HPI. ____________________________________________   PHYSICAL EXAM:  VITAL SIGNS: Vitals:   08/31/18 1430 08/31/18 1500  BP: (!) 91/54 102/61  Pulse:  61  Resp: 16 20  Temp:    SpO2:  98%    Constitutional: drowsy but protecting her airway Eyes: Conjunctivae are normal.  Head: Atraumatic. Nose: No congestion/rhinnorhea. Mouth/Throat: Mucous membranes are moist.   Neck: No stridor. Painless ROM.  Cardiovascular: Normal rate, regular rhythm. Grossly normal heart sounds.  Good peripheral circulation. Respiratory: Normal respiratory effort.  No retractions. Lungs CTAB. Gastrointestinal: Soft and nontender. No distention. No abdominal bruits. No CVA tenderness. Musculoskeletal: No lower extremity tenderness nor edema.  No joint effusions. Neurologic:  Normal speech and language. No gross focal neurologic deficits are appreciated. No facial droop Skin:  Skin is warm, dry and intact. No rash noted. Psychiatric: Mood and affect are withdrawn,  Uncooperative with exam. Speech and behavior are normal.  ____________________________________________   LABS (all labs ordered are listed, but only abnormal results are displayed)  Results for orders placed or performed during the hospital encounter of 08/31/18 (from the past 24 hour(s))  Comprehensive metabolic panel     Status: Abnormal   Collection Time: 08/31/18  1:15 PM  Result Value Ref Range   Sodium 138 135 - 145 mmol/L   Potassium 3.4 (L) 3.5 - 5.1 mmol/L   Chloride 106  98 - 111 mmol/L   CO2 23 22 - 32 mmol/L   Glucose, Bld 109 (H) 70 - 99 mg/dL   BUN 14 6 - 20 mg/dL   Creatinine, Ser 2.950.91 0.44 - 1.00 mg/dL   Calcium 8.9 8.9 - 28.410.3 mg/dL   Total Protein 6.8 6.5 - 8.1 g/dL   Albumin 3.9 3.5 - 5.0 g/dL   AST 94 (H) 15 - 41 U/L   ALT 80 (H) 0 - 44 U/L   Alkaline Phosphatase 72 38 - 126 U/L   Total Bilirubin 1.7 (H) 0.3 - 1.2 mg/dL    GFR calc non Af Amer >60 >60 mL/min   GFR calc Af Amer >60 >60 mL/min   Anion gap 9 5 - 15  Ethanol     Status: None   Collection Time: 08/31/18  1:15 PM  Result Value Ref Range   Alcohol, Ethyl (B) <10 <10 mg/dL  Salicylate level     Status: None   Collection Time: 08/31/18  1:15 PM  Result Value Ref Range   Salicylate Lvl <7.0 2.8 - 30.0 mg/dL  Acetaminophen level     Status: Abnormal   Collection Time: 08/31/18  1:15 PM  Result Value Ref Range   Acetaminophen (Tylenol), Serum 75 (H) 10 - 30 ug/mL  cbc     Status: Abnormal   Collection Time: 08/31/18  1:15 PM  Result Value Ref Range   WBC 3.9 (L) 4.0 - 10.5 K/uL   RBC 4.17 3.87 - 5.11 MIL/uL   Hemoglobin 12.3 12.0 - 15.0 g/dL   HCT 13.237.4 44.036.0 - 10.246.0 %   MCV 89.7 80.0 - 100.0 fL   MCH 29.5 26.0 - 34.0 pg   MCHC 32.9 30.0 - 36.0 g/dL   RDW 72.513.3 36.611.5 - 44.015.5 %   Platelets 173 150 - 400 K/uL   nRBC 0.0 0.0 - 0.2 %  Magnesium     Status: Abnormal   Collection Time: 08/31/18  1:15 PM  Result Value Ref Range   Magnesium 2.5 (H) 1.7 - 2.4 mg/dL  Protime-INR     Status: None   Collection Time: 08/31/18  2:34 PM  Result Value Ref Range   Prothrombin Time 12.3 11.4 - 15.2 seconds   INR 0.9 0.8 - 1.2   ____________________________________________  EKG My review and personal interpretation at Time: 13:04   Indication: OD  Rate: 75  Rhythm: sinus Axis: normal Other: prolonged qt, no stemi ____________________________________________  RADIOLOGY  I personally reviewed all radiographic images ordered to evaluate for the above acute complaints and reviewed radiology reports and findings.  These findings were personally discussed with the patient.  Please see medical record for radiology report.  ____________________________________________   PROCEDURES  Procedure(s) performed:  .Critical Care Performed by: Willy Eddyobinson, Milessa Hogan, MD Authorized by: Willy Eddyobinson, Tauno Falotico, MD   Critical care provider statement:    Critical care time  (minutes):  30   Critical care time was exclusive of:  Separately billable procedures and treating other patients   Critical care was necessary to treat or prevent imminent or life-threatening deterioration of the following conditions:  Toxidrome   Critical care was time spent personally by me on the following activities:  Development of treatment plan with patient or surrogate, discussions with consultants, evaluation of patient's response to treatment, examination of patient, obtaining history from patient or surrogate, ordering and performing treatments and interventions, ordering and review of laboratory studies, ordering and review of radiographic studies, pulse oximetry, re-evaluation of patient's condition  and review of old [place] performed: yes ____________________________________________   INITIAL IMPRESSION / ASSESSMENT AND PLAN / ED COURSE  Pertinent labs & imaging results that were available during my care of the patient were reviewed by me and considered in my medical decision making (see chart for details).   DDX: Overdose, electrolyte abnormality, intentional overdose, metabolic derangement  ODEAN MCELWAIN is a 58 y.o. who presents to the ED with symptoms as described above.  Patient protecting her airway.  Blood work sent for by differential.  Patient made IVC due to intentional overdose.  Does have prolonged QT.  Will consult poison control.  Bladder sent for the by differential.  Clinical Course as of Aug 30 1520  Sat Aug 31, 2018  1435 Patient with borderline elevated acetaminophen level.  Patient denies taking any Tylenol today.  Will repeat in 4 hours per poison control recommendations.  Will give potassium as well as magnesium for prolonged QT.  Patient will require 18 hours of observation per poison control.   [PR]    Clinical Course User Index [PR] Merlyn Lot, MD   The patient was evaluated in Emergency Department today for the symptoms  described in the history of present illness. He/she was evaluated in the context of the global COVID-19 pandemic, which necessitated consideration that the patient might be at risk for infection with the SARS-CoV-2 virus that causes COVID-19. Institutional protocols and algorithms that pertain to the evaluation of patients at risk for COVID-19 are in a state of rapid change based on information released by regulatory bodies including the CDC and federal and state organizations. These policies and algorithms were followed during the patient's care in the ED.   As part of my medical decision making, I reviewed the following data within the [place] notes reviewed and incorporated, Labs reviewed, notes from prior ED visits and [place] Controlled Substance Database   ____________________________________________   FINAL CLINICAL IMPRESSION(S) / ED DIAGNOSES  Final diagnoses:  Intentional drug overdose, initial encounter ([place])  Prolonged QT interval      NEW MEDICATIONS STARTED DURING THIS VISIT:  New Prescriptions   No medications on file     Note:  This document was prepared using Dragon voice recognition software and may include unintentional dictation errors.    Merlyn Lot, MD 08/31/18 [phone]

## 2018-09-01 DIAGNOSIS — F332 Major depressive disorder, recurrent severe without psychotic features: Secondary | ICD-10-CM | POA: Diagnosis present

## 2018-09-01 DIAGNOSIS — T50902A Poisoning by unspecified drugs, medicaments and biological substances, intentional self-harm, initial encounter: Secondary | ICD-10-CM

## 2018-09-01 LAB — BASIC METABOLIC PANEL
Anion gap: 5 (ref 5–15)
BUN: 11 mg/dL (ref 6–20)
CO2: 21 mmol/L — ABNORMAL LOW (ref 22–32)
Calcium: 7.9 mg/dL — ABNORMAL LOW (ref 8.9–10.3)
Chloride: 114 mmol/L — ABNORMAL HIGH (ref 98–111)
Creatinine, Ser: 0.72 mg/dL (ref 0.44–1.00)
GFR calc Af Amer: >60 mL/min (ref >60)
GFR calc non Af Amer: >60 mL/min (ref >60)
Glucose, Bld: 91 mg/dL (ref 70–99)
Potassium: 3.9 mmol/L (ref 3.5–5.1)
Sodium: 140 mmol/L (ref 135–145)

## 2018-09-01 LAB — CBC
HCT: 35.7 % — ABNORMAL LOW (ref 36.0–46.0)
Hemoglobin: 11.2 g/dL — ABNORMAL LOW (ref 12.0–15.0)
MCH: 29.3 pg (ref 26.0–34.0)
MCHC: 31.4 g/dL (ref 30.0–36.0)
MCV: 93.5 fL (ref 80.0–100.0)
Platelets: 154 10*3/uL (ref 150–400)
RBC: 3.82 MIL/uL — ABNORMAL LOW (ref 3.87–5.11)
RDW: 13.7 % (ref 11.5–15.5)
WBC: 5 10*3/uL (ref 4.0–10.5)
nRBC: 0 % (ref 0.0–0.2)

## 2018-09-01 LAB — HEPATIC FUNCTION PANEL
ALT: 128 U/L — ABNORMAL HIGH (ref 0–44)
AST: 118 U/L — ABNORMAL HIGH (ref 15–41)
Albumin: 3.2 g/dL — ABNORMAL LOW (ref 3.5–5.0)
Alkaline Phosphatase: 63 U/L (ref 38–126)
Bilirubin, Direct: 0.3 mg/dL — ABNORMAL HIGH (ref 0.0–0.2)
Indirect Bilirubin: 0.9 mg/dL (ref 0.3–0.9)
Total Bilirubin: 1.2 mg/dL (ref 0.3–1.2)
Total Protein: 5.4 g/dL — ABNORMAL LOW (ref 6.5–8.1)

## 2018-09-01 LAB — ACETAMINOPHEN LEVEL: Acetaminophen (Tylenol), Serum: 12 ug/mL (ref 10–30)

## 2018-09-01 MED ORDER — ACETYLCYSTEINE LOAD VIA INFUSION
150.0000 mg/kg | Freq: Once | INTRAVENOUS | Status: AC
  Administered 2018-09-01: 06:00:00 9885 mg via INTRAVENOUS
  Filled 2018-09-01: qty 248

## 2018-09-01 MED ORDER — ONDANSETRON HCL 4 MG/2ML IJ SOLN
4.0000 mg | Freq: Four times a day (QID) | INTRAMUSCULAR | Status: DC | PRN
  Administered 2018-09-01: 4 mg via INTRAVENOUS
  Filled 2018-09-01: qty 2

## 2018-09-01 MED ORDER — ONDANSETRON HCL 4 MG/2ML IJ SOLN
4.0000 mg | Freq: Once | INTRAMUSCULAR | Status: AC
  Administered 2018-09-01: 4 mg via INTRAVENOUS
  Filled 2018-09-01: qty 2

## 2018-09-01 MED ORDER — DEXTROSE 5 % IV SOLN
15.0000 mg/kg/h | INTRAVENOUS | Status: DC
  Administered 2018-09-01: 15 mg/kg/h via INTRAVENOUS
  Filled 2018-09-01: qty 200

## 2018-09-01 NOTE — Progress Notes (Signed)
[facility] [place] [facility]     PATIENT NAME: Ann GillsCathy Russman    MR#:  [id number]  DATE OF BIRTH:  1960-06-30  SUBJECTIVE:   Patient presented to the hospital due to suicide attempt/drug overdose this patient took Effexor, Tylenol and Ambien and etodolac.  Mental Status was worse on admission but much improved and patient is alert and oriented now.  She denies any suicidal or homicidal ideations presently.  REVIEW OF SYSTEMS:    Review of Systems  Constitutional: Negative for chills and fever.  HENT: Negative for congestion and tinnitus.   Eyes: Negative for blurred vision and double vision.  Respiratory: Negative for cough, shortness of breath and wheezing.   Cardiovascular: Negative for chest pain, orthopnea and PND.  Gastrointestinal: Negative for abdominal pain, diarrhea, nausea and vomiting.  Genitourinary: Negative for dysuria and hematuria.  Neurological: Negative for dizziness, sensory change and focal weakness.  All other systems reviewed and are negative.   Nutrition: Regular Tolerating Diet: Yes Tolerating PT: Ambulatory  DRUG ALLERGIES:  No Known Allergies  VITALS:  Blood pressure 120/69, pulse 75, temperature 98.6 F (37 C), resp. rate 18, height 5\' 4"  (1.626 m), weight 65.9 kg, SpO2 94 %.  PHYSICAL EXAMINATION:   Physical Exam  GENERAL:  58 y.o.-year-old patient lying in bed in no acute distress.  EYES: Pupils equal, round, reactive to light and accommodation. No scleral icterus. Extraocular muscles intact.  HEENT: Head atraumatic, normocephalic. Oropharynx and nasopharynx clear.  NECK:  Supple, no jugular venous distention. No thyroid enlargement, no tenderness.  LUNGS: Normal breath sounds bilaterally, no wheezing, rales, rhonchi. No use of accessory muscles of respiration.  CARDIOVASCULAR: S1, S2 normal. No murmurs, rubs, or gallops.  ABDOMEN: Soft, nontender, nondistended. Bowel sounds present. No organomegaly or mass.   EXTREMITIES: No cyanosis, clubbing or edema b/l.    NEUROLOGIC: Cranial nerves II through XII are intact. No focal Motor or sensory deficits b/l.   PSYCHIATRIC: The patient is alert and oriented x 3. Flat affect SKIN: No obvious rash, lesion, or ulcer.    LABORATORY PANEL:   CBC Recent Labs  Lab 09/01/18 0421  WBC 5.0  HGB 11.2*  HCT 35.7*  PLT 154   ------------------------------------------------------------------------------------------------------------------  Chemistries  Recent Labs  Lab 08/31/18 1315 09/01/18 0421  NA 138 140  K 3.4* 3.9  CL 106 114*  CO2 23 21*  GLUCOSE 109* 91  BUN 14 11  CREATININE 0.91 0.72  CALCIUM 8.9 7.9*  MG 2.5*  --   AST 94*  --   ALT 80*  --   ALKPHOS 72  --   BILITOT 1.7*  --    ------------------------------------------------------------------------------------------------------------------  Cardiac Enzymes No results for input(s): TROPONINI in the last 168 hours. ------------------------------------------------------------------------------------------------------------------  RADIOLOGY:  No results found.   ASSESSMENT AND PLAN:   58 year old female with past medical history of depression who presents to the hospital after altered mental status from drug overdose.  1.  Altered mental status-metabolic encephalopathy secondary to drug overdose. -Patient with combination of Tylenol, etodolac, Ambien and Effexor. - Mental status much improved and back to baseline now.  2.  Suicide attempt/drug overdose- patient is under IVC. - Await further psychiatric input.  3.  Acetaminophen toxicity- patient's Tylenol level was 75 on admission and now down to normal at 12. - will recheck LFT's today - Patient was given an acetylcysteine which can likely be discontinued shortly.  4. Abnormal LFT's - due to # 3.  - follow LFT's  and will repeat today and monitor.    All the records are reviewed and case discussed with Care  Management/Social Worker. Management plans discussed with the patient, family and they are in agreement.  CODE STATUS: Full code  DVT Prophylaxis: Lovenox  TOTAL TIME TAKING CARE OF THIS PATIENT: 30 minutes.   POSSIBLE D/C IN 1-2 DAYS, DEPENDING ON CLINICAL CONDITION.   Henreitta Leber M.D on 09/01/2018 at 1:04 PM  Between 7am to 6pm - Pager - [phone]  After 6pm go to [email or web address] - Proofreader  [facility] [place] [facility]  Office  [phone]  CC: Primary care physician; Rusty Aus, MD

## 2018-09-01 NOTE — Consult Note (Addendum)
MEDICATION RELATED CONSULT NOTE - INITIAL   Pharmacy Consult for pharmacist to assistance with acetaminophen overdose   No Known Allergies  Patient Measurements: Height: 5\' 4"  (162.6 cm) Weight: 145 lb 4.5 oz (65.9 kg) IBW/kg (Calculated) : 54.7  Vital Signs: Temp: 98.6 F (37 C) (06/28 0545) Temp Source: Oral (06/27 2027) BP: 120/69 (06/28 0545) Pulse Rate: 75 (06/28 0545) Intake/Output from previous day: 06/27 0701 - 06/28 0700 In: -  Out: 200 [Urine:200] Intake/Output from this shift: Total I/O In: -  Out: 200 [Urine:200]  Labs: Recent Labs    08/31/18 1315 09/01/18 0421  WBC 3.9* 5.0  HGB 12.3 11.2*  HCT 37.4 35.7*  PLT 173 154  CREATININE 0.91 0.72  MG 2.5*  --   ALBUMIN 3.9  --   PROT 6.8  --   AST 94*  --   ALT 80*  --   ALKPHOS 72  --   BILITOT 1.7*  --    Estimated Creatinine Clearance: 71.6 mL/min (by C-G formula based on SCr of 0.72 mg/dL).   Microbiology: Recent Results (from the past 720 hour(s))  SARS Coronavirus 2 (CEPHEID - Performed in [place] hospital lab), Hosp Order     Status: None   Collection Time: 08/31/18  1:15 PM   Specimen: Nasopharyngeal Swab  Result Value Ref Range Status   SARS Coronavirus 2 NEGATIVE NEGATIVE Final    Comment: (NOTE) If result is NEGATIVE SARS-CoV-2 target nucleic acids are NOT DETECTED. The SARS-CoV-2 RNA is generally detectable in upper and lower  respiratory specimens during the acute phase of infection. The lowest  concentration of SARS-CoV-2 viral copies this assay can detect is 250  copies / mL. A negative result does not preclude SARS-CoV-2 infection  and should not be used as the sole basis for treatment or other  patient management decisions.  A negative result may occur with  improper specimen collection / handling, submission of specimen other  than nasopharyngeal swab, presence of viral mutation(s) within the  areas targeted by this assay, and inadequate number of viral copies  (<250  copies / mL). A negative result must be combined with clinical  observations, patient history, and epidemiological information. If result is POSITIVE SARS-CoV-2 target nucleic acids are DETECTED. The SARS-CoV-2 RNA is generally detectable in upper and lower  respiratory specimens dur ing the acute phase of infection.  Positive  results are indicative of active infection with SARS-CoV-2.  Clinical  correlation with patient history and other diagnostic information is  necessary to determine patient infection status.  Positive results do  not rule out bacterial infection or co-infection with other viruses. If result is PRESUMPTIVE POSTIVE SARS-CoV-2 nucleic acids MAY BE PRESENT.   A presumptive positive result was obtained on the submitted specimen  and confirmed on repeat testing.  While 2019 novel coronavirus  (SARS-CoV-2) nucleic acids may be present in the submitted sample  additional confirmatory testing may be necessary for epidemiological  and / or clinical management purposes  to differentiate between  SARS-CoV-2 and other Sarbecovirus currently known to infect humans.  If clinically indicated additional testing with an alternate test  methodology [phone]) is advised. The SARS-CoV-2 RNA is generally  detectable in upper and lower respiratory sp ecimens during the acute  phase of infection. The expected result is Negative. Fact Sheet for Patients:  StrictlyIdeas.no Fact Sheet for Healthcare Providers: BankingDealers.co.za This test is not yet approved or cleared by the [place] FDA and has been authorized for detection and/or  diagnosis of SARS-CoV-2 by FDA under an Emergency Use Authorization (EUA).  This EUA will remain in effect (meaning this test can be used) for the duration of the COVID-19 declaration under Section 564(b)(1) of the Act, 21 U.S.C. section 360bbb-3(b)(1), unless the authorization is terminated or revoked  sooner. Performed at [facility], [address] [place], [place] [address]     Medical History: Past Medical History:  Diagnosis Date  . Depression     Assessment: Pharmacy consult for pharmacist assistance with acetaminophen overdose treatment plan including recommendations [facility] the [facility].   Patient admitted 6/27 afternoon following attempted suicide and ingestion of 3 Effexor, 10 Ambien, 6 valtrex and 15 etodolac. Acetamiophen level was elevated (~75) in ED  But patient denied taking any tylenol. At that time poison control recommended repeating lab in 4 hours.   Poison control contacted by RN on 6/28 @ 0250. Recommendation based on patient's labs and current condition was to start acetylcysteine infusion.  Acetaminophen Serum Level 6/27 @ 1315: 75 ug/mL 6/27 @ 1746: 56 ug/mL 6/28 @ 0504: 12 ug/mL   Plan:  6/28 AM: Acetylcysteine 150mg /kg x 1 bolus followed by acetylcysteine 15mg /kg/hr IV continous infusion for 23 hours and start EKG.   Per RN- Poison control recommends AST/ALT and APAP level 2 hours prior to infusion ending.   Pharmacy to continue to follow.     Gardner CandleSheema M Janani Chamber, PharmD, BCPS Clinical Pharmacist 09/01/2018 5:55 AM

## 2018-09-01 NOTE — Progress Notes (Addendum)
Call from RN that Zofran order needed to be verified. Pharmacist had secured chatted MD regarding QTR prolongation with this medication. He wants to continue with order. Order verified after call from RN.

## 2018-09-01 NOTE — Progress Notes (Signed)
Spoke to [employer] at [employer] about pt tylenol level of 12 if antidote is still warranted. Per Andee Poles, pt still needs drip and a recheck of tylenol level and liver function AST and ALT should be done 2 hrs prior to end of drip. Pharmacist is aware. Will continue to monitor.

## 2018-09-01 NOTE — Consult Note (Signed)
[facility] Consult   Reason for Consult:  Intentional overdose Referring Physician:  Dr Demetrius CharitySanini Patient Identification: Ann DuverneyCathy R Devilla MRN:  [id number] Principal Diagnosis: Major depressive disorder, recurrent severe without psychotic features (HCC) Diagnosis:  Principal Problem:   Major depressive disorder, recurrent severe without psychotic features (HCC) Active Problems:   Drug overdose  Total Time spent with patient: 1 hour  Subjective:   Ann Jimenez is a 58 y.o. female patient admitted with intentional overdose, suicide attempt.  "I'm Ok, just a little drowsy."  Patient reports she was "fussing with my son" prior to admission became upset and took an overdose.  Denies past attempts but noted in her chart the tendency to overtake her medications.  Denies past psychiatric history but takes Effexor--depression via PCP.  Denies current suicidal/homicidal ideations, hallucinations, and substance abuse.  Minimizes her suicide attempt.  Explained to the patient that due to the severity of her attempt we would transfer her to psychiatry after she was medically cleared.  She was not happy with this and stopped talking after saying "that's all I am going to say."  HPI:  On admission to ED:  Pt to ER via EMS with reports of possible overdose.  Family found pt mildly unresponsive, has hx of drug overdose.  Pt possibly took 3 effexor, 10 ambien, 6 valtrex, and 15 etodolac.  Pt admitted purposeful intent to EMS.  Past Psychiatric History: depression  Risk to Self:  yes Risk to Others:  no Prior Inpatient Therapy:  denies Prior Outpatient Therapy:  PCP  Past Medical History:  Past Medical History:  Diagnosis Date  . Depression     Past Surgical History:  Procedure Laterality Date  . AUGMENTATION MAMMAPLASTY  2004  . BREAST CYST EXCISION  02/2016   Family History:  Family History  Problem Relation Age of Onset  . Breast cancer Mother [id number]   Family Psychiatric  History:  none Social History:  Social History   Substance and Sexual Activity  Alcohol Use No  . Frequency: Never     Social History   Substance and Sexual Activity  Drug Use No    Social History   Socioeconomic History  . Marital status: Married    Spouse name: Not on file  . Number of children: Not on file  . Years of education: Not on file  . Highest education level: Not on file  Occupational History  . Not on file  Social Needs  . Financial resource strain: Not on file  . Food insecurity    Worry: Not on file    Inability: Not on file  . Transportation needs    Medical: Not on file    Non-medical: Not on file  Tobacco Use  . Smoking status: Never Smoker  . Smokeless tobacco: Never Used  Substance and Sexual Activity  . Alcohol use: No    Frequency: Never  . Drug use: No  . Sexual activity: Not on file  Lifestyle  . Physical activity    Days per week: Not on file    Minutes per session: Not on file  . Stress: Not on file  Relationships  . Social Musicianconnections    Talks on phone: Not on file    Gets together: Not on file    Attends religious service: Not on file    Active member of club or organization: Not on file    Attends meetings of clubs or organizations: Not on file    Relationship status:  Not on file  Other Topics Concern  . Not on file  Social History Narrative  . Not on file   Additional Social History:    Allergies:  No Known Allergies  Labs:  Results for orders placed or performed during the hospital encounter of 08/31/18 (from the past 48 hour(s))  Comprehensive metabolic panel     Status: Abnormal   Collection Time: 08/31/18  1:15 PM  Result Value Ref Range   Sodium 138 135 - 145 mmol/L   Potassium 3.4 (L) 3.5 - 5.1 mmol/L   Chloride 106 98 - 111 mmol/L   CO2 23 22 - 32 mmol/L   Glucose, Bld 109 (H) 70 - 99 mg/dL   BUN 14 6 - 20 mg/dL   Creatinine, Ser 0.91 0.44 - 1.00 mg/dL   Calcium 8.9 8.9 - 10.3 mg/dL   Total Protein 6.8 6.5 - 8.1  g/dL   Albumin 3.9 3.5 - 5.0 g/dL   AST 94 (H) 15 - 41 U/L   ALT 80 (H) 0 - 44 U/L   Alkaline Phosphatase 72 38 - 126 U/L   Total Bilirubin 1.7 (H) 0.3 - 1.2 mg/dL   GFR calc non Af Amer >60 >60 mL/min   GFR calc Af Amer >60 >60 mL/min   Anion gap 9 5 - 15    Comment: Performed at [facility], [address] [place], [place] [address]  Ethanol     Status: None   Collection Time: 08/31/18  1:15 PM  Result Value Ref Range   Alcohol, Ethyl (B) <10 <10 mg/dL    Comment: (NOTE) Lowest detectable limit for serum alcohol is 10 mg/dL. For medical purposes only. Performed at [facility], [address] [place], [place] [address]   Salicylate level     Status: None   Collection Time: 08/31/18  1:15 PM  Result Value Ref Range   Salicylate Lvl <9.3 2.8 - 30.0 mg/dL    Comment: Performed at [facility], [address] [place], [place] [address]  Acetaminophen level     Status: Abnormal   Collection Time: 08/31/18  1:15 PM  Result Value Ref Range   Acetaminophen (Tylenol), Serum 75 (H) 10 - 30 ug/mL    Comment: (NOTE) Therapeutic concentrations vary significantly. A range of 10-30 ug/mL  may be an effective concentration for many patients. However, some  are best treated at concentrations outside of this range. Acetaminophen concentrations >150 ug/mL at 4 hours after ingestion  and >50 ug/mL at 12 hours after ingestion are often associated with  toxic reactions. Performed at [facility], [address] [place], [place] [address]   cbc     Status: Abnormal   Collection Time: 08/31/18  1:15 PM  Result Value Ref Range   WBC 3.9 (L) 4.0 - 10.5 K/uL   RBC 4.17 3.87 - 5.11 MIL/uL   Hemoglobin 12.3 12.0 - 15.0 g/dL   HCT 37.4 36.0 - 46.0 %   MCV 89.7 80.0 - 100.0 fL   MCH 29.5 26.0 - 34.0 pg   MCHC 32.9 30.0 - 36.0 g/dL   RDW 13.3 11.5 - 15.5 %   Platelets 173 150 - 400 K/uL   nRBC 0.0 0.0 - 0.2 %    Comment: Performed at [facility], [address] [place], [place] [address]  Magnesium     Status: Abnormal   Collection Time: 08/31/18  1:15 PM  Result Value Ref Range   Magnesium 2.5 (  H) 1.7 - 2.4 mg/dL    Comment: Performed at [facility], [address] [place], [place] [address]  SARS Coronavirus 2 (CEPHEID - Performed in [facility] hospital lab), Hosp Order     Status: None   Collection Time: 08/31/18  1:15 PM   Specimen: Nasopharyngeal Swab  Result Value Ref Range   SARS Coronavirus 2 NEGATIVE NEGATIVE    Comment: (NOTE) If result is NEGATIVE SARS-CoV-2 target nucleic acids are NOT DETECTED. The SARS-CoV-2 RNA is generally detectable in upper and lower  respiratory specimens during the acute phase of infection. The lowest  concentration of SARS-CoV-2 viral copies this assay can detect is 250  copies / mL. A negative result does not preclude SARS-CoV-2 infection  and should not be used as the sole basis for treatment or other  patient management decisions.  A negative result may occur with  improper specimen collection / handling, submission of specimen other  than nasopharyngeal swab, presence of viral mutation(s) within the  areas targeted by this assay, and inadequate number of viral copies  (<250 copies / mL). A negative result must be combined with clinical  observations, patient history, and epidemiological information. If result is POSITIVE SARS-CoV-2 target nucleic acids are DETECTED. The SARS-CoV-2 RNA is generally detectable in upper and lower  respiratory specimens dur ing the acute phase of infection.  Positive  results are indicative of active infection with SARS-CoV-2.  Clinical  correlation with patient history and other diagnostic information is  necessary to determine patient infection status.  Positive results do  not rule out bacterial infection or co-infection with other viruses. If result is PRESUMPTIVE POSTIVE SARS-CoV-2 nucleic acids MAY BE PRESENT.   A  presumptive positive result was obtained on the submitted specimen  and confirmed on repeat testing.  While 2019 novel coronavirus  (SARS-CoV-2) nucleic acids may be present in the submitted sample  additional confirmatory testing may be necessary for epidemiological  and / or clinical management purposes  to differentiate between  SARS-CoV-2 and other Sarbecovirus currently known to infect humans.  If clinically indicated additional testing with an alternate test  methodology [phone]) is advised. The SARS-CoV-2 RNA is generally  detectable in upper and lower respiratory sp ecimens during the acute  phase of infection. The expected result is Negative. Fact Sheet for Patients:  BoilerBrush.com.cy Fact Sheet for Healthcare Providers: https://pope.com/ This test is not yet approved or cleared by the [place] FDA and has been authorized for detection and/or diagnosis of SARS-CoV-2 by FDA under an Emergency Use Authorization (EUA).  This EUA will remain in effect (meaning this test can be used) for the duration of the COVID-19 declaration under Section 564(b)(1) of the Act, 21 U.S.C. section 360bbb-3(b)(1), unless the authorization is terminated or revoked sooner. Performed at [facility], [address] [place], [place] [address]   Protime-INR     Status: None   Collection Time: 08/31/18  2:34 PM  Result Value Ref Range   Prothrombin Time 12.3 11.4 - 15.2 seconds   INR 0.9 0.8 - 1.2    Comment: (NOTE) INR goal varies based on device and disease states. Performed at [facility], [address] [place], [place] [address]   Acetaminophen level     Status: Abnormal   Collection Time: 08/31/18  5:46 PM  Result Value Ref Range   Acetaminophen (Tylenol), Serum 56 (H) 10 - 30 ug/mL    Comment: (NOTE) Therapeutic concentrations vary significantly. A range of 10-30  ug/mL  may be an effective concentration for many  patients. However, some  are best treated at concentrations outside of this range. Acetaminophen concentrations >150 ug/mL at 4 hours after ingestion  and >50 ug/mL at 12 hours after ingestion are often associated with  toxic reactions. Performed at [facility], [address] [place], [place] [address]   Urine Drug Screen, Qualitative     Status: None   Collection Time: 08/31/18  8:52 PM  Result Value Ref Range   Tricyclic, Ur Screen NONE DETECTED NONE DETECTED   Amphetamines, Ur Screen NONE DETECTED NONE DETECTED   MDMA (Ecstasy)Ur Screen NONE DETECTED NONE DETECTED   Cocaine Metabolite,Ur [place] NONE DETECTED NONE DETECTED   Opiate, Ur Screen NONE DETECTED NONE DETECTED   Phencyclidine (PCP) Ur S NONE DETECTED NONE DETECTED   Cannabinoid 50 Ng, Ur [place] NONE DETECTED NONE DETECTED   Barbiturates, Ur Screen NONE DETECTED NONE DETECTED   Benzodiazepine, Ur Scrn NONE DETECTED NONE DETECTED   Methadone Scn, Ur NONE DETECTED NONE DETECTED    Comment: (NOTE) Tricyclics + metabolites, urine    Cutoff 1000 ng/mL Amphetamines + metabolites, urine  Cutoff 1000 ng/mL MDMA (Ecstasy), urine              Cutoff 500 ng/mL Cocaine Metabolite, urine          Cutoff 300 ng/mL Opiate + metabolites, urine        Cutoff 300 ng/mL Phencyclidine (PCP), urine         Cutoff 25 ng/mL Cannabinoid, urine                 Cutoff 50 ng/mL Barbiturates + metabolites, urine  Cutoff 200 ng/mL Benzodiazepine, urine              Cutoff 200 ng/mL Methadone, urine                   Cutoff 300 ng/mL The urine drug screen provides only a preliminary, unconfirmed analytical test result and should not be used for non-medical purposes. Clinical consideration and professional judgment should be applied to any positive drug screen result due to possible interfering substances. A more specific alternate chemical method must be used in order to obtain a confirmed analytical result. Gas chromatography / mass  spectrometry (GC/MS) is the preferred confirmat ory method. Performed at [facility], [address] [place], [place] [address]   Basic metabolic panel     Status: Abnormal   Collection Time: 09/01/18  4:21 AM  Result Value Ref Range   Sodium 140 135 - 145 mmol/L   Potassium 3.9 3.5 - 5.1 mmol/L   Chloride 114 (H) 98 - 111 mmol/L   CO2 21 (L) 22 - 32 mmol/L   Glucose, Bld 91 70 - 99 mg/dL   BUN 11 6 - 20 mg/dL   Creatinine, Ser 0.980.72 0.44 - 1.00 mg/dL   Calcium 7.9 (L) 8.9 - 10.3 mg/dL   GFR calc non Af Amer >60 >60 mL/min   GFR calc Af Amer >60 >60 mL/min   Anion gap 5 5 - 15    Comment: Performed at [facility], [address] [place], [place] [address]  CBC     Status: Abnormal   Collection Time: 09/01/18  4:21 AM  Result Value Ref Range   WBC 5.0 4.0 - 10.5 K/uL   RBC 3.82 (L) 3.87 - 5.11 MIL/uL   Hemoglobin 11.2 (L) 12.0 - 15.0 g/dL   HCT  35.7 (L) 36.0 - 46.0 %   MCV 93.5 80.0 - 100.0 fL   MCH 29.3 26.0 - 34.0 pg   MCHC 31.4 30.0 - 36.0 g/dL   RDW 16.113.7 09.611.5 - 04.515.5 %   Platelets 154 150 - 400 K/uL   nRBC 0.0 0.0 - 0.2 %    Comment: Performed at [facility], [address] [place], [place] [address]  Hepatic function panel     Status: Abnormal   Collection Time: 09/01/18  4:21 AM  Result Value Ref Range   Total Protein 5.4 (L) 6.5 - 8.1 g/dL   Albumin 3.2 (L) 3.5 - 5.0 g/dL   AST 191118 (H) 15 - 41 U/L   ALT 128 (H) 0 - 44 U/L   Alkaline Phosphatase 63 38 - 126 U/L   Total Bilirubin 1.2 0.3 - 1.2 mg/dL   Bilirubin, Direct 0.3 (H) 0.0 - 0.2 mg/dL   Indirect Bilirubin 0.9 0.3 - 0.9 mg/dL    Comment: Performed at [facility], [address] [place], [place] [address]  Acetaminophen level     Status: None   Collection Time: 09/01/18  5:04 AM  Result Value Ref Range   Acetaminophen (Tylenol), Serum 12 10 - 30 ug/mL    Comment: (NOTE) Therapeutic concentrations vary significantly. A range of 10-30 ug/mL  may be an effective  concentration for many patients. However, some  are best treated at concentrations outside of this range. Acetaminophen concentrations >150 ug/mL at 4 hours after ingestion  and >50 ug/mL at 12 hours after ingestion are often associated with  toxic reactions. Performed at [facility], [address] [place], [place] [address]     Current Facility-Administered Medications  Medication Dose Route Frequency Provider Last Rate Last Dose  . 0.9 % NaCl with KCl 20 mEq/ L  infusion   Intravenous Continuous Shaune Pollackhen, Qing, MD 100 mL/hr at 09/01/18 0351    . acetylcysteine (ACETADOTE) 40,000 mg in dextrose 5 % 1,000 mL (40 mg/mL) infusion  15 mg/kg/hr Intravenous Continuous Arnaldo Nataliamond, Michael S, MD 24.7 mL/hr at 09/01/18 0707 15 mg/kg/hr at 09/01/18 0707  . bisacodyl (DULCOLAX) EC tablet 5 mg  5 mg Oral Daily PRN Shaune Pollackhen, Qing, MD      . enoxaparin (LOVENOX) injection 40 mg  40 mg Subcutaneous Q24H Shaune Pollackhen, Qing, MD   40 mg at 08/31/18 2058  . ondansetron (ZOFRAN) injection 4 mg  4 mg Intravenous Q6H PRN [place] SirenSainani, Vivek J, MD      . senna-docusate (Senokot-S) tablet 1 tablet  1 tablet Oral QHS PRN Shaune Pollackhen, Qing, MD        Musculoskeletal: Strength & Muscle Tone: within normal limits Gait & Station: normal Patient leans: N/A  Psychiatric Specialty Exam: Physical Exam  Nursing note and vitals reviewed. Constitutional: She is oriented to person, place, and time. She appears well-developed and well-nourished.  HENT:  Head: Normocephalic.  Neck: Normal range of motion.  Respiratory: Effort normal.  Musculoskeletal: Normal range of motion.  Neurological: She is alert and oriented to person, place, and time.  Psychiatric: Her speech is normal and behavior is normal. Her mood appears anxious. Cognition and memory are normal. She expresses impulsivity. She exhibits a depressed mood. She expresses suicidal ideation. She expresses suicidal plans.    Review of Systems  Constitutional: Positive for  malaise/fatigue.  HENT: Positive for sore throat.   Psychiatric/Behavioral: Positive for depression and suicidal ideas. The patient is nervous/anxious.   All other systems reviewed and are  negative.   Blood pressure 120/69, pulse 75, temperature 98.6 F (37 C), resp. rate 18, height  (1.626 m), weight 65.9 kg, SpO2 94 %.Body mass index is 24.94 kg/m.  General Appearance: Disheveled  Eye Contact:  Fair  Speech:  Normal Rate  Volume:  Decreased  Mood:  Anxious and Depressed  Affect:  Congruent  Thought Process:  Coherent and Descriptions of Associations: Intact  Orientation:  Full (Time, Place, and Person)  Thought Content:  Rumination  Suicidal Thoughts:  Yes.  with intent/plan  Homicidal Thoughts:  No  Memory:  Immediate;   Fair Recent;   Fair Remote;   Fair  Judgement:  Poor  Insight:  Lacking  Psychomotor Activity:  Decreased  Concentration:  Concentration: Fair and Attention Span: Fair  Recall:  [employer] of Knowledge:  Fair  Language:  Good  Akathisia:  No  Handed:  Right  AIMS (if indicated):     Assets:  Housing Leisure Time Physical Health Resilience  ADL's:  Intact  Cognition:  WNL  Sleep:        Treatment Plan Summary: Daily contact with patient to assess and evaluate symptoms and progress in treatment, Medication management and Plan major depressive disorder, recurrent, severe without psychosis:  -Medically clearing the overdose at this time, no medication recommended due to this   Insomnia: -Await for medical clearance to address with  medications  Disposition: Recommend psychiatric Inpatient admission when medically cleared.  Nanine Means, NP 09/01/2018 4:05 PM

## 2018-09-01 NOTE — Progress Notes (Signed)
Spoke with Ebony Hail at [employer] about pt current condition. Provide answers to questions asked. Ebony Hail will consult with toxicologist and call back to verify recommendation.

## 2018-09-01 NOTE — Progress Notes (Signed)
Call from RN that Zofran order needed to be verified. Pharmacist had secured chatted MD regarding QTR prolongation with this medication. He wants to continue with order for a one-time dose. Order verified after call from RN.  Update: Patient already received the one-time dose.

## 2018-09-02 LAB — HIV ANTIBODY (ROUTINE TESTING W REFLEX): HIV Screen 4th Generation wRfx: NONREACTIVE

## 2018-09-02 LAB — COMPREHENSIVE METABOLIC PANEL
ALT: 91 U/L — ABNORMAL HIGH (ref 0–44)
AST: 46 U/L — ABNORMAL HIGH (ref 15–41)
Albumin: 3 g/dL — ABNORMAL LOW (ref 3.5–5.0)
Alkaline Phosphatase: 63 U/L (ref 38–126)
Anion gap: 10 (ref 5–15)
BUN: <5 mg/dL — ABNORMAL LOW (ref 6–20)
CO2: 22 mmol/L (ref 22–32)
Calcium: 8.2 mg/dL — ABNORMAL LOW (ref 8.9–10.3)
Chloride: 111 mmol/L (ref 98–111)
Creatinine, Ser: 0.5 mg/dL (ref 0.44–1.00)
GFR calc Af Amer: >60 mL/min (ref >60)
GFR calc non Af Amer: >60 mL/min (ref >60)
Glucose, Bld: 101 mg/dL — ABNORMAL HIGH (ref 70–99)
Potassium: 3.3 mmol/L — ABNORMAL LOW (ref 3.5–5.1)
Sodium: 143 mmol/L (ref 135–145)
Total Bilirubin: 1 mg/dL (ref 0.3–1.2)
Total Protein: 5.3 g/dL — ABNORMAL LOW (ref 6.5–8.1)

## 2018-09-02 LAB — ACETAMINOPHEN LEVEL: Acetaminophen (Tylenol), Serum: <10 ug/mL — ABNORMAL LOW (ref 10–30)

## 2018-09-02 MED ORDER — IBUPROFEN 400 MG PO TABS
400.0000 mg | ORAL_TABLET | Freq: Four times a day (QID) | ORAL | Status: DC | PRN
  Administered 2018-09-02 – 2018-09-03 (×2): 400 mg via ORAL
  Filled 2018-09-02 (×2): qty 1

## 2018-09-02 MED ORDER — ONDANSETRON HCL 4 MG/2ML IJ SOLN
4.0000 mg | Freq: Four times a day (QID) | INTRAMUSCULAR | Status: DC | PRN
  Administered 2018-09-02 (×2): 4 mg via INTRAVENOUS
  Filled 2018-09-02 (×3): qty 2

## 2018-09-02 MED ORDER — LIDOCAINE 5 % EX PTCH
1.0000 | MEDICATED_PATCH | CUTANEOUS | Status: DC
  Administered 2018-09-02: 1 via TRANSDERMAL
  Filled 2018-09-02 (×2): qty 1

## 2018-09-02 NOTE — Progress Notes (Signed)
[place] [facility] [place] NAME: Mekisha Bittel    MR#:  [id number]  DATE OF BIRTH:  1960-04-18  SUBJECTIVE:   Patient presented to the hospital due to suicide attempt/drug overdose this patient took Effexor, Tylenol and Ambien and etodolac.  Mental status much improved and back to baseline.  LFTs have improved, Tylenol level normalized.  No other acute events overnight.  Still having some intermittent nausea.  REVIEW OF SYSTEMS:    Review of Systems  Constitutional: Negative for chills and fever.  HENT: Negative for congestion and tinnitus.   Eyes: Negative for blurred vision and double vision.  Respiratory: Negative for cough, shortness of breath and wheezing.   Cardiovascular: Negative for chest pain, orthopnea and PND.  Gastrointestinal: Positive for nausea. Negative for abdominal pain, diarrhea and vomiting.  Genitourinary: Negative for dysuria and hematuria.  Neurological: Negative for dizziness, sensory change and focal weakness.  All other systems reviewed and are negative.   Nutrition: Regular Tolerating Diet: Yes Tolerating PT: Ambulatory  DRUG ALLERGIES:  No Known Allergies  VITALS:  Blood pressure (!) 136/58, pulse 64, temperature 98.8 F (37.1 C), temperature source Oral, resp. rate 18, height 5\' 4"  (1.626 m), weight 65.9 kg, SpO2 96 %.  PHYSICAL EXAMINATION:   Physical Exam  GENERAL:  58 y.o.-year-old patient lying in bed in no acute distress.  EYES: Pupils equal, round, reactive to light and accommodation. No scleral icterus. Extraocular muscles intact.  HEENT: Head atraumatic, normocephalic. Oropharynx and nasopharynx clear.  NECK:  Supple, no jugular venous distention. No thyroid enlargement, no tenderness.  LUNGS: Normal breath sounds bilaterally, no wheezing, rales, rhonchi. No use of accessory muscles of respiration.  CARDIOVASCULAR: S1, S2 normal. No murmurs, rubs, or gallops.  ABDOMEN: Soft, nontender,  nondistended. Bowel sounds present. No organomegaly or mass.  EXTREMITIES: No cyanosis, clubbing or edema b/l.    NEUROLOGIC: Cranial nerves II through XII are intact. No focal Motor or sensory deficits b/l.   PSYCHIATRIC: The patient is alert and oriented x 3. Flat affect SKIN: No obvious rash, lesion, or ulcer.    LABORATORY PANEL:   CBC Recent Labs  Lab 09/01/18 0421  WBC 5.0  HGB 11.2*  HCT 35.7*  PLT 154   ------------------------------------------------------------------------------------------------------------------  Chemistries  Recent Labs  Lab 08/31/18 1315  09/02/18 0253  NA 138   < > 143  K 3.4*   < > 3.3*  CL 106   < > 111  CO2 23   < > 22  GLUCOSE 109*   < > 101*  BUN 14   < > <5*  CREATININE 0.91   < > 0.50  CALCIUM 8.9   < > 8.2*  MG 2.5*  --   --   AST 94*   < > 46*  ALT 80*   < > 91*  ALKPHOS 72   < > 63  BILITOT 1.7*   < > 1.0   < > = values in this interval not displayed.   ------------------------------------------------------------------------------------------------------------------  Cardiac Enzymes No results for input(s): TROPONINI in the last 168 hours. ------------------------------------------------------------------------------------------------------------------  RADIOLOGY:  No results found.   ASSESSMENT AND PLAN:   58 year old female with past medical history of depression who presents to the hospital after altered mental status from drug overdose.  1.  Altered mental status-metabolic encephalopathy secondary to drug overdose. -Patient took combination of Tylenol, etodolac, Ambien and Effexor. - Mental status much improved and back to baseline now.  2.  Suicide attempt/drug overdose- patient is under IVC. - Seen by psychiatry and they recommend inpatient psychiatric admission when medically stable.  Discussed with them today that patient is stable to be discharged to behavioral medicine.  3.  Acetaminophen toxicity-  patient's Tylenol level was 75 on admission and now down to <10.  - LFT's have improved.  Off NAC now.   - stable to be discharged to inpatient Psych.   4. Abnormal LFT's - due to # 3.  - improving.    Stable for transfer to inpatient behavioral medicine.   All the records are reviewed and case discussed with Care Management/Social Worker. Management plans discussed with the patient, family and they are in agreement.  CODE STATUS: Full code  DVT Prophylaxis: Lovenox  TOTAL TIME TAKING CARE OF THIS PATIENT: 30 minutes.   POSSIBLE D/C IN 1-2 DAYS, DEPENDING ON CLINICAL CONDITION.   Houston SirenVivek J Rhetta Cleek M.D on 09/02/2018 at 1:53 PM  Between 7am to 6pm - Pager - [phone]  After 6pm go to [email or web address] - Social research officer, governmentpassword EPAS [facility]  [facility] [place] [facility]  Office  [phone]  CC: Primary care physician; Danella PentonMiller, Mark F, MD

## 2018-09-02 NOTE — Progress Notes (Signed)
Poison Control Verified AM labs, gave recommendation to stop gtt due to am lab results.  Requested EKG and mentioned that MD yesterday spoke of a INR to be drawn but informed her that INR was not ordered at this time. Reported to Dr. Marcille Blanco, stopped infusion for overdose of tylenol and completed EKG as ordered.

## 2018-09-02 NOTE — Consult Note (Signed)
MEDICATION RELATED CONSULT NOTE - INITIAL   Pharmacy Consult for pharmacist to assistance with acetaminophen overdose   No Known Allergies  Patient Measurements: Height: 5\' 4"  (162.6 cm) Weight: 145 lb 4.5 oz (65.9 kg) IBW/kg (Calculated) : 54.7  Vital Signs: Temp: 98.8 F (37.1 C) (06/28 2050) Temp Source: Oral (06/28 2050) BP: 136/58 (06/28 2050) Pulse Rate: 64 (06/28 2050) Intake/Output from previous day: 06/28 0701 - 06/29 0700 In: 2503.9 [I.V.:2503.9] Out: -  Intake/Output from this shift: Total I/O In: 2503.9 [I.V.:2503.9] Out: -   Labs: Recent Labs    08/31/18 1315 09/01/18 0421 09/02/18 0253  WBC 3.9* 5.0  --   HGB 12.3 11.2*  --   HCT 37.4 35.7*  --   PLT 173 154  --   CREATININE 0.91 0.72 0.50  MG 2.5*  --   --   ALBUMIN 3.9 3.2* 3.0*  PROT 6.8 5.4* 5.3*  AST 94* 118* 46*  ALT 80* 128* 91*  ALKPHOS 72 63 63  BILITOT 1.7* 1.2 1.0  BILIDIR  --  0.3*  --   IBILI  --  0.9  --    Estimated Creatinine Clearance: 71.6 mL/min (by C-G formula based on SCr of 0.5 mg/dL).   Microbiology: Recent Results (from the past 720 hour(s))  SARS Coronavirus 2 (CEPHEID - Performed in [place] hospital lab), Hosp Order     Status: None   Collection Time: 08/31/18  1:15 PM   Specimen: Nasopharyngeal Swab  Result Value Ref Range Status   SARS Coronavirus 2 NEGATIVE NEGATIVE Final    Comment: (NOTE) If result is NEGATIVE SARS-CoV-2 target nucleic acids are NOT DETECTED. The SARS-CoV-2 RNA is generally detectable in upper and lower  respiratory specimens during the acute phase of infection. The lowest  concentration of SARS-CoV-2 viral copies this assay can detect is 250  copies / mL. A negative result does not preclude SARS-CoV-2 infection  and should not be used as the sole basis for treatment or other  patient management decisions.  A negative result may occur with  improper specimen collection / handling, submission of specimen other  than nasopharyngeal swab,  presence of viral mutation(s) within the  areas targeted by this assay, and inadequate number of viral copies  (<250 copies / mL). A negative result must be combined with clinical  observations, patient history, and epidemiological information. If result is POSITIVE SARS-CoV-2 target nucleic acids are DETECTED. The SARS-CoV-2 RNA is generally detectable in upper and lower  respiratory specimens dur ing the acute phase of infection.  Positive  results are indicative of active infection with SARS-CoV-2.  Clinical  correlation with patient history and other diagnostic information is  necessary to determine patient infection status.  Positive results do  not rule out bacterial infection or co-infection with other viruses. If result is PRESUMPTIVE POSTIVE SARS-CoV-2 nucleic acids MAY BE PRESENT.   A presumptive positive result was obtained on the submitted specimen  and confirmed on repeat testing.  While 2019 novel coronavirus  (SARS-CoV-2) nucleic acids may be present in the submitted sample  additional confirmatory testing may be necessary for epidemiological  and / or clinical management purposes  to differentiate between  SARS-CoV-2 and other Sarbecovirus currently known to infect humans.  If clinically indicated additional testing with an alternate test  methodology [phone]) is advised. The SARS-CoV-2 RNA is generally  detectable in upper and lower respiratory sp ecimens during the acute  phase of infection. The expected result is Negative. Fact Sheet  for Patients:  BoilerBrush.com.cyhttps://www.fda.gov/media/136312/download Fact Sheet for Healthcare Providers: https://pope.com/https://www.fda.gov/media/136313/download This test is not yet approved or cleared by the [place] FDA and has been authorized for detection and/or diagnosis of SARS-CoV-2 by FDA under an Emergency Use Authorization (EUA).  This EUA will remain in effect (meaning this test can be used) for the duration of the COVID-19 declaration under  Section 564(b)(1) of the Act, 21 U.S.C. section 360bbb-3(b)(1), unless the authorization is terminated or revoked sooner. Performed at [facility], [address] [place], [place] [address]     Medical History: Past Medical History:  Diagnosis Date  . Depression     Assessment: Pharmacy consult for pharmacist assistance with acetaminophen overdose treatment plan including recommendations [facility].   Patient admitted 6/27 afternoon following attempted suicide and ingestion of 3 Effexor, 10 Ambien, 6 valtrex and 15 etodolac. Acetamiophen level was elevated (~75) in ED  But patient denied taking any tylenol. At that time poison control recommended repeating lab in 4 hours.   Poison control contacted by RN on 6/28 @ 0250. Recommendation based on patient's labs and current condition was to start acetylcysteine infusion.  6/28 AM: Acetylcysteine 150mg /kg x 1 bolus followed by acetylcysteine 15mg /kg/hr IV continous infusion for 23 hours and start EKG.  Per RN- Poison control recommends AST/ALT and APAP level 2 hours prior to infusion ending.   AST: 94>>118>> 46 ALT: 80>> 128>> 91  Acetaminophen Serum Level 6/27 @ 1315: 75 ug/mL 6/27 @ 1746: 56 ug/mL 6/28 @ 0504: 12 ug/mL 6/29 @ 0253: <10  Plan:   6/29 APAP <10 and ALT and AST trending down.  According to Poison Control NAC infusion can be discontinued at this time.    Gardner CandleSheema M Don Giarrusso, PharmD, BCPS Clinical Pharmacist 09/02/2018 4:09 AM

## 2018-09-02 NOTE — Progress Notes (Signed)
   09/02/18 1100  Clinical Encounter Type  Visited With Patient;Health care provider  Visit Type Follow-up  Referral From Nurse  Spiritual Encounters  Spiritual Needs Prayer;Emotional  Stress Factors  Patient Stress Factors Major life changes  Ch went in for AD education. Pt was instructed on how to fill out and complete the form. Ch engaged the pt in a conversation where she talked about her readiness to get to some emotional/spiritual work at behavioral med. Pt has had a rough relationship with her son with whom she wants to restore the broken relationship. Pt shed tears when she thought of her son from missing him and wanting to get things right again. Pt asked for a prayer and ch prayed with her for restoration of relationships and getting better.

## 2018-09-03 ENCOUNTER — Inpatient Hospital Stay
Admission: AD | Admit: 2018-09-03 | Discharge: 2018-09-05 | DRG: 885 | Disposition: A | Discharge status: Home / Self Care | Payer: 59 | Source: Intra-hospital | Attending: Psychiatry | Admitting: Psychiatry

## 2018-09-03 ENCOUNTER — Other Ambulatory Visit: Payer: Self-pay

## 2018-09-03 DIAGNOSIS — F332 Major depressive disorder, recurrent severe without psychotic features: Secondary | ICD-10-CM | POA: Diagnosis not present

## 2018-09-03 DIAGNOSIS — Z803 Family history of malignant neoplasm of breast: Secondary | ICD-10-CM | POA: Diagnosis not present

## 2018-09-03 DIAGNOSIS — F329 Major depressive disorder, single episode, unspecified: Secondary | ICD-10-CM | POA: Diagnosis present

## 2018-09-03 DIAGNOSIS — Z888 Allergy status to other drugs, medicaments and biological substances status: Secondary | ICD-10-CM

## 2018-09-03 DIAGNOSIS — Z915 Personal history of self-harm: Secondary | ICD-10-CM

## 2018-09-03 DIAGNOSIS — R45851 Suicidal ideations: Secondary | ICD-10-CM | POA: Diagnosis present

## 2018-09-03 DIAGNOSIS — T50901A Poisoning by unspecified drugs, medicaments and biological substances, accidental (unintentional), initial encounter: Secondary | ICD-10-CM | POA: Diagnosis present

## 2018-09-03 MED ORDER — ZOLPIDEM TARTRATE 5 MG PO TABS
5.0000 mg | ORAL_TABLET | Freq: Every evening | ORAL | Status: DC | PRN
  Administered 2018-09-03: 5 mg via ORAL
  Filled 2018-09-03: qty 1

## 2018-09-03 MED ORDER — MAGNESIUM HYDROXIDE 400 MG/5ML PO SUSP: 30.0000 mL | Freq: Every day | ORAL | Status: DC | PRN

## 2018-09-03 MED ORDER — ALUM & MAG HYDROXIDE-SIMETH 200-200-20 MG/5ML PO SUSP: 30.0000 mL | ORAL | Status: DC | PRN

## 2018-09-03 MED ORDER — ACETAMINOPHEN 325 MG PO TABS
650.0000 mg | ORAL_TABLET | Freq: Four times a day (QID) | ORAL | Status: DC | PRN
  Administered 2018-09-03 – 2018-09-04 (×2): 650 mg via ORAL
  Filled 2018-09-03 (×2): qty 2

## 2018-09-03 NOTE — Progress Notes (Signed)
Spoke with megan snider rn in [facility] and gave report on pt coming to them.  pts vss. A/o.  No distress. 1:1 sitter still in attendance. Pt calm and cooperative.

## 2018-09-03 NOTE — Discharge Summary (Signed)
[facility] [place] at [facility]   PATIENT NAME: Ann GillsCathy Jimenez    MR#:  [id number]  DATE OF BIRTH:  1960-11-10  DATE OF ADMISSION:  08/31/2018 ADMITTING PHYSICIAN: Shaune PollackQing Chen, MD  DATE OF DISCHARGE: 09/03/2018  PRIMARY CARE PHYSICIAN: Danella PentonMiller, Mark F, MD    ADMISSION DIAGNOSIS:  Prolonged QT interval [R94.31] Intentional drug overdose, initial encounter (HCC) [T50.902A]  DISCHARGE DIAGNOSIS:  Principal Problem:   Major depressive disorder, recurrent severe without psychotic features (HCC) Active Problems:   Drug overdose   SECONDARY DIAGNOSIS:   Past Medical History:  Diagnosis Date  . Depression     HOSPITAL COURSE:   58 year old female with past medical history of depression who presents to the hospital after altered mental status from drug overdose.  1.  Altered mental status-metabolic encephalopathy secondary to drug overdose. -Patient took combination of Tylenol, etodolac, Ambien and Effexor. - Mental status much improved and back to baseline now.  2.  Suicide attempt/drug overdose- patient is under IVC. - Seen by psychiatry and they recommend inpatient psychiatric admission when medically stable.  Discussed with them today that patient is stable to be discharged to behavioral medicine and they have a bed for the patient.  - cont. Further care as per Psych.   3.  Acetaminophen toxicity- patient's Tylenol level was 75 on admission and now down to <10.  - LFT's have improved.  Off NAC now.   - stable to be discharged to inpatient Psych.   4. Abnormal LFT's - due to # 3.  - resolved and can be further followed as outpatient.   DISCHARGE CONDITIONS:   Stable.   CONSULTS OBTAINED:    DRUG ALLERGIES:  No Known Allergies  DISCHARGE MEDICATIONS:   Allergies as of 09/03/2018   No Known Allergies     Medication List    TAKE these medications   cyclobenzaprine 10 MG tablet Commonly known as: FLEXERIL Take 10 mg by mouth 2 (two) times  daily as needed for muscle spasms.   etodolac 400 MG tablet Commonly known as: LODINE Take 400 mg by mouth 2 (two) times daily as needed for pain.   venlafaxine XR 75 MG 24 hr capsule Commonly known as: EFFEXOR-XR Take 75 mg by mouth daily.   zolpidem 10 MG tablet Commonly known as: AMBIEN Take 10 mg by mouth at bedtime.         DISCHARGE INSTRUCTIONS:   DIET:  Regular diet  DISCHARGE CONDITION:  Stable  ACTIVITY:  Activity as tolerated  OXYGEN:  Home Oxygen: No.   Oxygen Delivery: room air  DISCHARGE LOCATION:  Behavioral medicine.    If you experience worsening of your admission symptoms, develop shortness of breath, life threatening emergency, suicidal or homicidal thoughts you must seek medical attention immediately by calling 911 or calling your MD immediately  if symptoms less severe.  You Must read complete instructions/literature along with all the possible adverse reactions/side effects for all the Medicines you take and that have been prescribed to you. Take any new Medicines after you have completely understood and accpet all the possible adverse reactions/side effects.   Please note  You were cared for by a hospitalist during your hospital stay. If you have any questions about your discharge medications or the care you received while you were in the hospital after you are discharged, you can call the unit and asked to speak with the hospitalist on call if the hospitalist that took care of you is not available. Once  you are discharged, your primary care physician will handle any further medical issues. Please note that NO REFILLS for any discharge medications will be authorized once you are discharged, as it is imperative that you return to your primary care physician (or establish a relationship with a primary care physician if you do not have one) for your aftercare needs so that they can reassess your need for medications and monitor your lab  values.     Today   No acute events overnight.  Patient tolerating p.o. well.  No nausea vomiting or any other complaints.  Will discharge to behavioral medicine today.  VITAL SIGNS:  Blood pressure 135/63, pulse 61, temperature 98.6 F (37 C), temperature source Oral, resp. rate 18, height 5\' 4"  (1.626 m), weight 65.9 kg, SpO2 99 %.  I/O:    Intake/Output Summary (Last 24 hours) at 09/03/2018 1445 Last data filed at 09/03/2018 0900 Gross per 24 hour  Intake 927.14 ml  Output -  Net 927.14 ml    PHYSICAL EXAMINATION:  GENERAL:  58 y.o.-year-old patient lying in the bed with no acute distress.  EYES: Pupils equal, round, reactive to light and accommodation. No scleral icterus. Extraocular muscles intact.  HEENT: Head atraumatic, normocephalic. Oropharynx and nasopharynx clear.  NECK:  Supple, no jugular venous distention. No thyroid enlargement, no tenderness.  LUNGS: Normal breath sounds bilaterally, no wheezing, rales,rhonchi. No use of accessory muscles of respiration.  CARDIOVASCULAR: S1, S2 normal. No murmurs, rubs, or gallops.  ABDOMEN: Soft, non-tender, non-distended. Bowel sounds present. No organomegaly or mass.  EXTREMITIES: No pedal edema, cyanosis, or clubbing.  NEUROLOGIC: Cranial nerves II through XII are intact. No focal motor or sensory defecits b/l.  PSYCHIATRIC: The patient is alert and oriented x 3. Flat affect SKIN: No obvious rash, lesion, or ulcer.   DATA REVIEW:   CBC Recent Labs  Lab 09/01/18 0421  WBC 5.0  HGB 11.2*  HCT 35.7*  PLT 154    Chemistries  Recent Labs  Lab 08/31/18 1315  09/02/18 0253  NA 138   < > 143  K 3.4*   < > 3.3*  CL 106   < > 111  CO2 23   < > 22  GLUCOSE 109*   < > 101*  BUN 14   < > <5*  CREATININE 0.91   < > 0.50  CALCIUM 8.9   < > 8.2*  MG 2.5*  --   --   AST 94*   < > 46*  ALT 80*   < > 91*  ALKPHOS 72   < > 63  BILITOT 1.7*   < > 1.0   < > = values in this interval not displayed.    Cardiac  Enzymes No results for input(s): TROPONINI in the last 168 hours.  Microbiology Results  Results for orders placed or performed during the hospital encounter of 08/31/18  SARS Coronavirus 2 (CEPHEID - Performed in [place] hospital lab), Hosp Order     Status: None   Collection Time: 08/31/18  1:15 PM   Specimen: Nasopharyngeal Swab  Result Value Ref Range Status   SARS Coronavirus 2 NEGATIVE NEGATIVE Final    Comment: (NOTE) If result is NEGATIVE SARS-CoV-2 target nucleic acids are NOT DETECTED. The SARS-CoV-2 RNA is generally detectable in upper and lower  respiratory specimens during the acute phase of infection. The lowest  concentration of SARS-CoV-2 viral copies this assay can detect is 250  copies / mL. A negative result does  not preclude SARS-CoV-2 infection  and should not be used as the sole basis for treatment or other  patient management decisions.  A negative result may occur with  improper specimen collection / handling, submission of specimen other  than nasopharyngeal swab, presence of viral mutation(s) within the  areas targeted by this assay, and inadequate number of viral copies  (<250 copies / mL). A negative result must be combined with clinical  observations, patient history, and epidemiological information. If result is POSITIVE SARS-CoV-2 target nucleic acids are DETECTED. The SARS-CoV-2 RNA is generally detectable in upper and lower  respiratory specimens dur ing the acute phase of infection.  Positive  results are indicative of active infection with SARS-CoV-2.  Clinical  correlation with patient history and other diagnostic information is  necessary to determine patient infection status.  Positive results do  not rule out bacterial infection or co-infection with other viruses. If result is PRESUMPTIVE POSTIVE SARS-CoV-2 nucleic acids MAY BE PRESENT.   A presumptive positive result was obtained on the submitted specimen  and confirmed on repeat  testing.  While 2019 novel coronavirus  (SARS-CoV-2) nucleic acids may be present in the submitted sample  additional confirmatory testing may be necessary for epidemiological  and / or clinical management purposes  to differentiate between  SARS-CoV-2 and other Sarbecovirus currently known to infect humans.  If clinically indicated additional testing with an alternate test  methodology [phone] is advised. The SARS-CoV-2 RNA is generally  detectable in upper and lower respiratory sp ecimens during the acute  phase of infection. The expected result is Negative. Fact Sheet for Patients:  BoilerBrush.com.cyhttps://www.fda.gov/media/136312/download Fact Sheet for Healthcare Providers: https://pope.com/https://www.fda.gov/media/136313/download This test is not yet approved or cleared by the [place] FDA and has been authorized for detection and/or diagnosis of SARS-CoV-2 by FDA under an Emergency Use Authorization (EUA).  This EUA will remain in effect (meaning this test can be used) for the duration of the COVID-19 declaration under Section 564(b)(1) of the Act, 21 U.S.C. section 360bbb-3(b)(1), unless the authorization is terminated or revoked sooner. Performed at [facility], [address] [place], [place] [address]     RADIOLOGY:  No results found.    Management plans discussed with the patient, family and they are in agreement.  CODE STATUS:     Code Status Orders  (From admission, onward)         Start     Ordered   08/31/18 1641  Full code  Continuous     08/31/18 1641        Code Status History    This patient has a current code status but no historical code status.   Advance Care Planning Activity      TOTAL TIME TAKING CARE OF THIS PATIENT: 40 minutes.    Houston SirenVivek J Brinton Brandel M.D on 09/03/2018 at 2:45 PM  Between 7am to 6pm - Pager - [phone]  After 6pm go to [email or web address] - Social research officer, governmentpassword EPAS [facility]  [facility] [place] [facility]  Office   [phone]  CC: Primary care physician; Danella PentonMiller, Mark F, MD

## 2018-09-03 NOTE — Progress Notes (Signed)
pts belongings returned to her. Cell phone.white tone  Rings 1 with glass stone and one  Band,top and navy shorts.and empty  Pill bottles

## 2018-09-03 NOTE — Progress Notes (Signed)
   09/03/18 1300  Clinical Encounter Type  Visited With Patient;Health care provider  Visit Type Follow-up  Ch followed up. Pt said she didn't sleep well last night. Pt said she's been feeling better and better and got to talk to his family including her son. Pt appreciated ch's visit yesterday and said it helped. Pt is looking forward to moving to the Behavioral Medicine to work on self-exploration and healing.

## 2018-09-03 NOTE — Consult Note (Signed)
  Patient is a 58 year old female with a lo who was admitted with an overdose in a suicide attempt.  Patient is currently medically stable for admission to the inpatient psychiatric unit.  She has serious psychosocial issues with falling out with her son and also her husband.  She has never been seen by a psychiatrist or a therapist.  She has had several medication trials in the past and reports that Effexor has not been working for her depression. She will need inpatient psychiatric admission for stabilization of her mood and develop collaborative care for her outpatient need an.d support system  Disposition-patient will be admitted to the inpatient behavioral health unit once a bed becomes available

## 2018-09-03 NOTE — Tx Team (Signed)
Initial Treatment Plan 09/03/2018 6:07 PM [name] KYH:[id number]    PATIENT STRESSORS: Marital or family conflict   PATIENT STRENGTHS: Average or above average intelligence Warehouse manager means Physical Health Supportive family/friends Work skills   PATIENT IDENTIFIED PROBLEMS: depression  Anxiety                    DISCHARGE CRITERIA:  Improved stabilization in mood, thinking, and/or behavior Motivation to continue treatment in a less acute level of care Verbal commitment to aftercare and medication compliance  PRELIMINARY DISCHARGE PLAN: Participate in family therapy Return to previous living arrangement  PATIENT/FAMILY INVOLVEMENT: This treatment plan has been presented to and reviewed with the patient, Ann Jimenez. The patient has been given the opportunity to ask questions and make suggestions.  Kieth Brightly, RN 09/03/2018, 6:07 PM

## 2018-09-03 NOTE — Plan of Care (Signed)
Pt denies depression, SI, HI and AVH. Pt rates anxiety 7/10. Pt says that she has had "time to think" since she has been upstairs and she is "so blessed". Pt has been oriented to the unit and educated on the care plan, She verbalizes understanding. Collier Bullock RN Problem: Education: Goal: Utilization of techniques to improve thought processes will improve Outcome: Progressing Goal: Knowledge of the prescribed therapeutic regimen will improve Outcome: Progressing   Problem: Activity: Goal: Interest or engagement in leisure activities will improve Outcome: Progressing Goal: Imbalance in normal sleep/wake cycle will improve Outcome: Progressing   Problem: Coping: Goal: Coping ability will improve Outcome: Progressing Goal: Will verbalize feelings Outcome: Progressing   Problem: Education: Goal: Ability to state activities that reduce stress will improve Outcome: Progressing   Problem: Coping: Goal: Ability to identify and develop effective coping behavior will improve Outcome: Progressing   Problem: Self-Concept: Goal: Ability to identify factors that promote anxiety will improve Outcome: Progressing Goal: Level of anxiety will decrease Outcome: Progressing Goal: Ability to modify response to factors that promote anxiety will improve Outcome: Progressing

## 2018-09-03 NOTE — BH Assessment (Signed)
Patient has been accepted to [facility].  Accepting physician is Dr. Einar Grad.  Attending Physician will be Dr. Weber Cooks.  Patient has been assigned to room 309, by [place] Charge Nurse Demetria.  Call report to [phone].  Representative/Transfer Coordinator is Print production planner [facility] TTS) Patient pre-admitted by [facility] Patient Access Butch Penny)

## 2018-09-04 DIAGNOSIS — F332 Major depressive disorder, recurrent severe without psychotic features: Principal | ICD-10-CM

## 2018-09-04 MED ORDER — VENLAFAXINE HCL ER 75 MG PO CP24
150.0000 mg | ORAL_CAPSULE | Freq: Every day | ORAL | Status: DC
  Administered 2018-09-05: 150 mg via ORAL
  Filled 2018-09-04: qty 2

## 2018-09-04 MED ORDER — TRAZODONE HCL 100 MG PO TABS
100.0000 mg | ORAL_TABLET | Freq: Every day | ORAL | Status: DC
  Administered 2018-09-04: 21:00:00 100 mg via ORAL
  Filled 2018-09-04: qty 1

## 2018-09-04 NOTE — BHH Group Notes (Signed)
[facility] Group Notes:  (Nursing/MHT/Case Management/Adjunct)  Date:  09/04/2018  Time:  8:48 PM  Type of Therapy:  Group Therapy  Participation Level:  Active  Participation Quality:  Appropriate  Affect:  Appropriate  Cognitive:  Alert  Insight:  Good  Engagement in Group:  Engaged  Modes of Intervention:  Support  Summary of Progress/Problems:  Ann Jimenez 09/04/2018, 8:48 PM

## 2018-09-04 NOTE — Progress Notes (Signed)
Recreation Therapy Notes   Date: 09/04/2018  Time: 9:30 am  Location: Craft room  Behavioral response: Appropriate   Intervention Topic: Self-esteem   Discussion/Intervention:  Group content today was focused on self-esteem. Patient defined self-esteem and where it comes form. The group described reasons self-esteem is important. Individuals stated things that impact self-esteem and positive ways to improve self-esteem. The group participated in the intervention "Exploring Self-esteem" where patients were able to explore what things that makes them who they are. Clinical Observations/Feedback:  Patient came to group late due to unknown reasons. She explained that she could think more positive, communicate more and take time for herself to help improve her self-esteem. Individual participated in the intervention while in atttendance.  Lalania Haseman LRT/CTRS          Ishi Danser 09/04/2018 11:13 AM

## 2018-09-04 NOTE — BHH Group Notes (Signed)
LCSW Group Therapy Note  09/04/2018 1:00 PM  Type of Therapy/Topic:  Group Therapy:  Emotion Regulation  Participation Level:  Active   Description of Group:   The purpose of this group is to assist patients in learning to regulate negative emotions and experience positive emotions. Patients will be guided to discuss ways in which they have been vulnerable to their negative emotions. These vulnerabilities will be juxtaposed with experiences of positive emotions or situations, and patients will be challenged to use positive emotions to combat negative ones. Special emphasis will be placed on coping with negative emotions in conflict situations, and patients will process healthy conflict resolution skills.  Therapeutic Goals: 1. Patient will identify two positive emotions or experiences to reflect on in order to balance out negative emotions 2. Patient will label two or more emotions that they find the most difficult to experience 3. Patient will demonstrate positive conflict resolution skills through discussion and/or role plays  Summary of Patient Progress: Patient attended group and was active in group. Patient shared that learning emotion regulation skills would be beneficial.  CSW reviewed Check the Facts, Opposite Action, PLEASE and Increase Positive activities as coping skills to support regulating emotions.  Patient was attentive and discussed how she has struggled with the emotions discussed in each skill.   Therapeutic Modalities:   Cognitive Behavioral Therapy Feelings Identification Dialectical Behavioral Therapy  Assunta Curtis, MSW, LCSW 09/04/2018 2:01 PM
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
## 2018-09-04 NOTE — BHH Suicide Risk Assessment (Signed)
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  Transferred from the medical service after recovering from an overdose.  Patient describes symptoms of major depression and anxiety going back months.  Admits to having made a suicide attempt prior to hospitalization.  Currently denies any suicidal ideation.  No evidence of acute psychosis.  Remains sad and anxious but says that she is feeling more comfortable now that she feels like there is a chance to get better.  Shows good insight.  Continued Clinical Symptoms:  Alcohol Use Disorder Identification Test Final Score (AUDIT): 0 The "Alcohol Use Disorders Identification Test", Guidelines for Use in Primary Care, Second Edition.  World Science writerHealth Organization [facility](WHO). Score between 0-7:  no or low risk or alcohol related problems. Score between 8-15:  moderate risk of alcohol related problems. Score between 16-19:  high risk of alcohol related problems. Score 20 or above:  warrants further diagnostic evaluation for alcohol dependence and treatment.   CLINICAL FACTORS:   Depression:   Impulsivity   Musculoskeletal: Strength & Muscle Tone: within normal limits Gait & Station: normal Patient leans: N/A  Psychiatric Specialty Exam: Physical Exam  Nursing note and vitals reviewed. Constitutional: She appears well-developed and well-nourished.  HENT:  Head: Normocephalic and atraumatic.  Eyes: Pupils are equal, round, and reactive to light. Conjunctivae are  normal.  Neck: Normal range of motion.  Cardiovascular: Regular rhythm and normal heart sounds.  Respiratory: Effort normal.  GI: Soft.  Musculoskeletal: Normal range of motion.  Neurological: She is alert.  Skin: Skin is warm and dry.  Psychiatric: Judgment normal. Her speech is delayed. She is slowed. Thought content is not paranoid. Cognition and memory are impaired. She exhibits a depressed mood. She expresses no suicidal ideation.    Review of Systems  Constitutional: Negative.   HENT: Negative.   Eyes: Negative.   Respiratory: Negative.   Cardiovascular: Negative.   Gastrointestinal: Negative.   Musculoskeletal: Negative.   Skin: Negative.   Neurological: Negative.   Psychiatric/Behavioral: Positive for depression and memory loss. Negative for hallucinations, substance abuse and suicidal ideas. The patient is nervous/anxious and has insomnia.     Blood pressure 114/85, pulse 84, temperature 98.3 F (36.8 C), temperature source Oral, resp. rate 17, height 5\' 5"  (1.651 m), weight 68.9 kg, SpO2 100 %.Body mass index is 25.29 kg/m.  General Appearance: Fairly Groomed  Eye Contact:  Good  Speech:  Clear and Coherent  Volume:  Normal  Mood:  Depressed  Affect:  Tearful  Thought Process:  Goal Directed  Orientation:  Full (Time, Place, and Person)  Thought Content:  Logical  Suicidal Thoughts:  No  Homicidal Thoughts:  No  Memory:  Immediate;   Fair Recent;   Fair Remote;   Fair  Judgement:  Fair  Insight:  Present  Psychomotor Activity:  Decreased  Concentration:  Concentration: Fair  Recall:  FiservFair  Fund of Knowledge:  Fair  Language:  Fair  Akathisia:  No  Handed:  Right  AIMS (if indicated):     Assets:  Desire for Improvement Housing  Physical Health Resilience  ADL's:  Intact  Cognition:  WNL  Sleep:  Number of Hours: 7      COGNITIVE FEATURES THAT CONTRIBUTE TO RISK:  Thought constriction (tunnel vision)    SUICIDE RISK:   Mild:  Suicidal ideation  of limited frequency, intensity, duration, and specificity.  There are no identifiable plans, no associated intent, mild dysphoria and related symptoms, good self-control (both objective and subjective assessment), few other risk factors, and identifiable protective factors, including available and accessible social support.  PLAN OF CARE: Admit to the psychiatric ward.  15-minute checks.  Review medication and make some changes to better address depression.  Involved patient in individual and group therapy.  Reassess suicidality prior to discharge and work on appropriate outpatient disposition.  I certify that inpatient services furnished can reasonably be expected to improve the patient's condition.   Alethia Berthold, MD 09/04/2018, 4:32 PM

## 2018-09-04 NOTE — Progress Notes (Signed)
Recreation Therapy Notes  INPATIENT RECREATION THERAPY ASSESSMENT  Patient Details Name: SATINA JERRELL MRN: [id number] DOB: 06/23/1960 Today's Date: 09/04/2018       Information Obtained From: Patient  Able to Participate in Assessment/Interview: Yes  Patient Presentation: Responsive  Reason for Admission (Per Patient): Active Symptoms, Suicidal Ideation  Patient Stressors: Family  Coping Skills:   Journal, Talk, Hot Bath/Shower  Leisure Interests (2+):  Lawyer, Social - Family, Therapist, music - Programmer, multimedia gardening, Joretta Bachelor care  Frequency of Recreation/Participation: Financial risk analyst Resources:     [employer]:     Current Use:    If no, Barriers?:    Expressed Interest in [place] of Residence:  Hydrologist  Patient Main Form of Transportation: Musician  Patient Strengths:  Outgoing, Friendly  Patient Identified Areas of Improvement:  My attitude  Patient Goal for Hospitalization:  To get a better in sight  Current SI (including self-harm):  No  Current HI:  No  Current AVH: No  Staff Intervention Plan: Group Attendance, Collaborate with Interdisciplinary Treatment Team  Consent to Intern Participation: N/A  Kerah Hardebeck 09/04/2018, 3:32 PM

## 2018-09-04 NOTE — Tx Team (Addendum)
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 acetaminophen (TYLENOL) tablet 650 mg  650 mg Oral Q6H PRN Daleen Boavi, Himabindu, MD   650 mg at 09/03/18 2301  . alum & mag hydroxide-simeth (MAALOX/MYLANTA) 200-200-20 MG/5ML suspension 30 mL  30 mL Oral Q4H PRN Ravi, Himabindu, MD      . magnesium hydroxide (MILK OF MAGNESIA) suspension 30 mL  30 mL Oral Daily PRN Ravi, Himabindu, MD      . traZODone (DESYREL) tablet 100 mg  100 mg Oral QHS Clapacs, John T, MD      . Melene Muller[START ON 09/05/2018] venlafaxine XR (EFFEXOR-XR) 24 hr capsule 150 mg  150 mg Oral Q breakfast Clapacs, John T, MD       PTA Medications: Medications Prior to Admission  Medication Sig Dispense Refill Last Dose  . cyclobenzaprine (FLEXERIL) 10 MG tablet Take 10 mg by mouth 2 (two) times daily as needed for muscle spasms.   prn at prn  . etodolac (LODINE) 400 MG tablet Take 400 mg by mouth 2 (two) times daily as needed for pain.   prn at prn  . venlafaxine XR (EFFEXOR-XR) 75 MG 24 hr capsule Take 75 mg by mouth daily.   Past Week at Unknown time  . zolpidem (AMBIEN) 10 MG tablet Take 10 mg by mouth at bedtime.   unknown at unknown    Patient Stressors: Marital or family conflict  Patient Strengths: Average or above average intelligence Psychologist, counsellingCommunication skills Financial means Physical Health Supportive family/friends Work skills  Treatment Modalities: Medication Management, Group therapy, Case management,  1 to 1 session with clinician, Psychoeducation, Recreational therapy.   Physician Treatment Plan for Primary Diagnosis: <principal problem not  specified> Long Term Goal(s):     Short Term Goals:    Medication Management: Evaluate patient's response, side effects, and tolerance of medication regimen.  Therapeutic Interventions: 1 to 1 sessions, Unit Group sessions and Medication administration.  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  Therapeutic Interventions: 1 to 1 sessions, Unit Group sessions and Medication administration.  Evaluation of Outcomes: Progressing   RN Treatment Plan for Primary Diagnosis: <principal problem not specified> Long Term Goal(s): Knowledge of disease and therapeutic regimen to maintain health will improve  Short Term Goals: Ability to verbalize feelings will improve, Ability to disclose and discuss suicidal ideas, Ability to identify and develop effective coping behaviors will improve and Compliance with prescribed medications will improve  Medication Management: RN will administer medications as ordered by provider, will assess and evaluate patient's response and provide education to patient for prescribed medication. RN will report any adverse and/or side effects to prescribing provider.  Therapeutic Interventions: 1 on 1 counseling sessions, Psychoeducation, Medication administration, Evaluate responses to treatment, Monitor vital signs and CBGs as ordered, Perform/monitor CIWA, COWS, AIMS and Fall Risk screenings as ordered, Perform wound care treatments as ordered.  Evaluation of Outcomes: Progressing   LCSW Treatment Plan for Primary Diagnosis: <  principal problem not specified> Long Term Goal(s): Safe transition to appropriate next level of care at discharge, Engage patient in therapeutic group  addressing interpersonal concerns.  Short Term Goals: Engage patient in aftercare planning with referrals and resources  Therapeutic Interventions: Assess for all discharge needs, 1 to 1 time with Social worker, Explore available resources and support systems, Assess for adequacy in community support network, Educate family and significant other(s) on suicide prevention, Complete Psychosocial Assessment, Interpersonal group therapy.  Evaluation of Outcomes: Progressing   Progress in Treatment: Attending groups: Yes. Participating in groups: Yes. Taking medication as prescribed: Yes. Toleration medication: Yes. Family/Significant other contact made: Yes, individual(s) contacted:  Lyman Bishop, husband Patient understands diagnosis: Yes. Discussing patient identified problems/goals with staff: Yes. Medical problems stabilized or resolved: Yes. Denies suicidal/homicidal ideation: Yes. Issues/concerns per patient self-inventory: No. Other: NA  New problem(s) identified: No, Describe:  None reported  New Short Term/Long Term Goal(s):Attend outpatient treatment, take medication as prescribed, develop and implement healthy coping methods to manage stress  Patient Goals:  "Get things in perspective, get my head right"  Discharge Plan or Barriers: Pt will return home and request referral to [facility].  Reason for Continuation of Hospitalization: Medication stabilization  Estimated Length of Stay: 3-5 days; pt may discharge on 7/2 or 7/3 based on continued progress  Recreational Therapy: Patient Stressors: Family Patient Goal: Patient will engage in groups without prompting or encouragement from LRT x3 group sessions within 5 recreation therapy group sessions   Attendees: Patient:Ann Jimenez 09/04/2018 3:15 PM  Physician: Alethia Berthold 09/04/2018 3:15 PM  Nursing:  09/04/2018 3:15 PM  RN Care Manager: 09/04/2018 3:15 PM  Social Worker: Anise Salvo 09/04/2018 3:15 PM  Recreational Therapist: Isaias Sakai Aster Eckrich 09/04/2018 3:15 PM  Other:  09/04/2018 3:15 PM  Other:  09/04/2018 3:15 PM  Other: 09/04/2018 3:15 PM    Scribe for Treatment Team: Yvette Rack, LCSW 09/04/2018 3:15 PM
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
## 2018-09-04 NOTE — Progress Notes (Signed)
Patient alert and oriented x 4, affect is flat but she brightens upon approach, interacting appropriately with peers and staff, her thoughts are organized and coherent rated depression a 6/10 ( 0 low- 10 high) she appears less anxious but apprehensive, and she also demanded for a sleeping aide. HCP on call notified order received for Ambien 5 mg once, support ad encouragement offered will continue to monitor.

## 2018-09-04 NOTE — Plan of Care (Signed)
  Problem: Coping: Goal: Will verbalize feelings Outcome: Progressing  Patient verbalized feelings to staff.  

## 2018-09-04 NOTE — Plan of Care (Signed)
Patient knowledgeable of [place] information ,  able to verbalize  information received . Emotional and Mental  status improving. Limited  participation  with unit programing . Voice  no concerns around  sleep  .  Voice of no safety concerns  Patient  thought process remained altered. Compliant  with medication . Working  on Scientific laboratory technician and anxiety.  Problem: Education: Goal: Utilization of techniques to improve thought processes will improve Outcome: Progressing Goal: Knowledge of the prescribed therapeutic regimen will improve Outcome: Progressing   Problem: Activity: Goal: Interest or engagement in leisure activities will improve Outcome: Progressing Goal: Imbalance in normal sleep/wake cycle will improve Outcome: Progressing
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
## 2018-09-05 MED ORDER — VENLAFAXINE HCL ER 150 MG PO CP24: 150.0000 mg | ORAL_CAPSULE | Freq: Every day | ORAL | 1 refills | Status: DC

## 2018-09-05 MED ORDER — TRAZODONE HCL 100 MG PO TABS: 100.0000 mg | ORAL_TABLET | Freq: Every day | ORAL | 1 refills | Status: DC

## 2018-09-05 NOTE — [facility] Suicide Risk Assessment (Signed)
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   HENT: Negative.   Eyes: Negative.   Respiratory: Negative.   Cardiovascular: Negative.   Gastrointestinal: Negative.   Musculoskeletal: Negative.   Skin: Negative.   Neurological: Negative.   Psychiatric/Behavioral: Negative.     Blood pressure (!) 102/50, pulse 78, temperature 98.6 F (37 C), temperature source Oral, resp. rate 18, height 5\' 5"  (1.651 m), weight 68.9 kg, SpO2 99 %.Body mass index is 25.29 kg/m.  General Appearance: Fairly Groomed  Engineer, water::  Good  Speech:  Clear and [id number]  Volume:  Normal  Mood:  Euthymic  Affect:  Appropriate  Thought Process:  Goal Directed  Orientation:  Full (Time, Place, and Person)  Thought Content:  Logical  Suicidal Thoughts:  No  Homicidal Thoughts:  No  Memory:  Immediate;   Fair Recent;   Fair Remote;   Fair  Judgement:  Fair  Insight:  Fair  Psychomotor Activity:  Normal  Concentration:  Fair  Recall:  [employer] [place]  Language: Fair  Akathisia:  No  Handed:  Right  AIMS (if indicated):     Assets:  Communication Skills Desire for Improvement Financial Resources/Insurance Housing Physical Health Resilience Social Support  Sleep:  Number of Hours: 7.5  Cognition: WNL  ADL's:  Intact   Mental Status Per Nursing Assessment::   On Admission:  NA  Demographic Factors:  Caucasian  Loss Factors: NA  Historical Factors: Prior suicide attempts  Risk Reduction Factors:    Responsible for children under 30 years of age, Sense of responsibility to family, Religious beliefs about death, Employed, Living with another person, especially a relative, Positive social support and Positive coping skills or problem solving skills  Continued Clinical Symptoms:  Depression:   Impulsivity  Cognitive Features That Contribute To Risk:  None    Suicide Risk:  Minimal: No identifiable suicidal ideation.  Patients presenting with no risk factors but with morbid ruminations; may be classified as minimal risk based on the severity of the depressive symptoms  Follow-up Information    [facility] Follow up.   Why: Please follow up. Contact information: [address],  [place], [place] [address] P: [phone]          Plan Of Care/Follow-up recommendations:  Activity:  Activity as tolerated.  I agree with the patient that another week off of work would be good for her and will give her a work note authorizing that. Diet:  Regular diet Other:  Follow-up with outpatient psychiatric care.  Referrals given.  Prescriptions given.  Psychoeducation completed with patient particularly regarding the risks of chronic use of Ambien all of which she agrees to.  Alethia Berthold, MD 09/05/2018, 9:21 AM

## 2018-09-05 NOTE — Discharge Summary (Signed)
Physician Discharge Summary Note  Patient:  Ann Jimenez is an 58 y.o., female MRN:  [id number] DOB:  1960-12-07 Patient phone:  [phone] (home)  Patient address:   [place] [address],  Total Time spent with patient: 45 minutes  Date of Admission:  09/03/2018 Date of Discharge: September 05, 2018  Reason for Admission: Patient was transferred to the psychiatric unit after a few days on the medical service.  She had been admitted to the medical service through the emergency room because of an overdose on a combination of her home medication.  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  No previous suicide attempts.  No previous hospitalization  Past Medical History:  Past Medical History:  Diagnosis Date  . Depression     Past Surgical History:  Procedure Laterality Date  . AUGMENTATION MAMMAPLASTY  2004  . BREAST CYST EXCISION  02/2016   Family History:  Family History  Problem Relation Age of Onset  . Breast cancer Mother 60   Family Psychiatric  History: None reported Social History:  Social History   Substance and Sexual Activity  Alcohol Use No  . Frequency: Never     Social History   Substance and Sexual Activity  Drug Use No    Social History   Socioeconomic History  . Marital status: Married    Spouse name: Not on file  . Number of children: Not on file  . Years of education: Not on file  . Highest education level: Not on file  Occupational History  . Not on file  Social Needs  . Financial resource strain: Not on file  . Food insecurity    Worry: Not on file    Inability: Not on file  .  Transportation needs    Medical: Not on file    Non-medical: Not on file  Tobacco Use  . Smoking status: Never Smoker  . Smokeless tobacco: Never Used  Substance and Sexual Activity  . Alcohol use: No    Frequency: Never  . Drug use: No  . Sexual activity: Not on file  Lifestyle  . Physical activity    Days per week: Not on file    Minutes per session: Not on file  . Stress: Not on file  Relationships  . Social Herbalist on phone: Not on file    Gets together: Not on file    Attends religious service: Not on file    Active member of club or organization: Not on file    Attends meetings of clubs or organizations: Not on file    Relationship status: Not on file  Other Topics Concern  . Not on file  Social History Narrative  . Not on file    Hospital Course: Patient was admitted to the psychiatric ward.  She was appropriate and cooperative throughout her time on the unit she did not display any dangerous or suicidal behavior.  She was appropriate in her interactions and forthcoming.  After meeting with me I suggested that we increase the dose of her Effexor to 150 mg/day as she had been taking only 75 prior to admission.  She agreed to this.  I explained to her  that chronic use of Ambien was associated with memory impairment and a syndrome of "sleepwalking" type behavior which could cause it to be dangerous.  I encouraged her to discontinue her use of Ambien in the future and we replaced it with trazodone as needed for sleep.  Patient is very agreeable to outpatient treatment.  She has spoken with her family and reports that she feels very supported by them and comfortable going home.  Denies any suicidal thoughts whatsoever.  Patient will be discharged today with her current prescriptions and is given information on referral for outpatient psychotherapy and medication management.  She agrees to all of this.  Physical Findings: AIMS:  , ,  ,  ,    CIWA:    COWS:      Musculoskeletal: Strength & Muscle Tone: within normal limits Gait & Station: normal Patient leans: N/A  Psychiatric Specialty Exam: Physical Exam  Nursing note and vitals reviewed. Constitutional: She appears well-developed and well-nourished.  HENT:  Head: Normocephalic and atraumatic.  Eyes: Pupils are equal, round, and reactive to light. Conjunctivae are normal.  Neck: Normal range of motion.  Cardiovascular: Regular rhythm and normal heart sounds.  Respiratory: Effort normal.  GI: Soft.  Musculoskeletal: Normal range of motion.  Neurological: She is alert.  Skin: Skin is warm and dry.  Psychiatric: She has a normal mood and affect. Her speech is normal and behavior is normal. Judgment and thought content normal. Cognition and memory are normal.    Review of Systems  Constitutional: Negative.   HENT: Negative.   Eyes: Negative.   Respiratory: Negative.   Cardiovascular: Negative.   Gastrointestinal: Negative.   Musculoskeletal: Negative.   Skin: Negative.   Neurological: Negative.   Psychiatric/Behavioral: Positive for depression. Negative for hallucinations, memory loss, substance abuse and suicidal ideas. The patient is not nervous/anxious and does not have insomnia.     Blood pressure (!) 102/50, pulse 78, temperature 98.6 F (37 C), temperature source Oral, resp. rate 18, height 5\' 5"  (1.651 m), weight 68.9 kg, SpO2 99 %.Body mass index is 25.29 kg/m.  General Appearance: Casual  Eye Contact:  Good  Speech:  Clear and Coherent  Volume:  Normal  Mood:  Euthymic  Affect:  Congruent  Thought Process:  Goal Directed  Orientation:  Full (Time, Place, and Person)  Thought Content:  Logical  Suicidal Thoughts:  No  Homicidal Thoughts:  No  Memory:  Immediate;   Fair Recent;   Fair Remote;   Fair  Judgement:  Fair  Insight:  Fair  Psychomotor Activity:  Normal  Concentration:  Concentration: Fair  Recall:  [employer]  Fund of Knowledge:  Fair  Language:  Fair   Akathisia:  No  Handed:  Right  AIMS (if indicated):     Assets:  Communication Skills Desire for Improvement Financial Resources/Insurance Housing Physical Health Resilience Social Support  ADL's:  Intact  Cognition:  WNL  Sleep:  Number of Hours: 7.5        Has this patient used any form of tobacco in the last 30 days? (Cigarettes, Smokeless Tobacco, Cigars, and/or Pipes) Yes, Yes, A prescription for an FDA-approved tobacco cessation medication was offered at discharge and the patient refused  Blood Alcohol level:  Lab Results  Component Value Date   ETH <10 08/31/2018   ETH <10 04/03/2017    Metabolic Disorder Labs:  No results found for: HGBA1C, MPG No results found for: PROLACTIN No results found for: CHOL, TRIG, HDL, CHOLHDL, VLDL, LDLCALC  See Psychiatric Specialty Exam and Suicide Risk Assessment completed by Attending Physician prior to discharge.  Discharge destination:  Home  Is patient on multiple antipsychotic therapies at discharge:  No   Has Patient had three or more failed trials of antipsychotic monotherapy by history:  No  Recommended Plan for Multiple Antipsychotic Therapies: NA  Discharge Instructions    Diet - low sodium heart healthy   Complete by: As directed    Increase activity slowly   Complete by: As directed      Allergies as of 09/05/2018      Reactions   Zolpidem Other (See Comments)   Hallucinations       Medication List    STOP taking these medications   cyclobenzaprine 10 MG tablet Commonly known as: FLEXERIL   etodolac 400 MG tablet Commonly known as: LODINE   zolpidem 10 MG tablet Commonly known as: AMBIEN     TAKE these medications     Indication  traZODone 100 MG tablet Commonly known as: DESYREL Take 1 tablet (100 mg total) by mouth at bedtime.  Indication: Trouble Sleeping   venlafaxine XR 150 MG 24 hr capsule Commonly known as: EFFEXOR-XR Take 1 capsule (150 mg total) by mouth daily with  breakfast. Start taking on: September 06, 2018 What changed:   medication strength  how much to take  when to take this  Indication: Major Depressive Disorder      Follow-up Information    [facility] Follow up.   Why: Please follow up. Contact information: [address],  [place], [place] [address] P: [phone]          Follow-up recommendations:  Activity:  Activity as tolerated.  Patient feels that another week at home would give her time to recover emotionally and physically.  I agreed to this and will give her a work note suggesting she return in a week from this next Monday. Diet:  Regular diet Other:  Follow-up with finding a psychotherapist as soon as possible follow-up with current medication management with primary care doctor or find a psychiatrist at your earliest convenience.  Comments: Patient appears to be at low risk of acute dangerousness no longer meeting commitment criteria and appropriate for discharge.  Signed: Mordecai RasmussenJohn , MD 09/05/2018, 9:28 AM

## 2018-09-05 NOTE — Progress Notes (Signed)
Recreation Therapy Notes  INPATIENT RECREATION TR PLAN  Patient Details Name: Ann Jimenez MRN: [id number] DOB: 12/18/1960 Today's Date: 09/05/2018  Rec Therapy Plan Is patient appropriate for Therapeutic Recreation?: Yes Treatment times per week: At least 3 Estimated Length of Stay: 5-7 days TR Treatment/Interventions: Group participation (Comment)  Discharge Criteria Pt will be discharged from therapy if:: Discharged Treatment plan/goals/alternatives discussed and agreed upon by:: Patient/family  Discharge Summary Short term goals set: Patient will engage in groups without prompting or encouragement from LRT x3 group sessions within 5 recreation therapy group sessions Short term goals met: Not met, Adequate for discharge Progress toward goals comments: Groups attended Which groups?: Self-esteem Reason goals not met: N/A Therapeutic equipment acquired: N/A Reason patient discharged from therapy: Discharge from hospital Pt/family agrees with progress & goals achieved: Yes Date patient discharged from therapy: 09/05/18   Raneem Mendolia 09/05/2018, 11:36 AM

## 2018-09-05 NOTE — Progress Notes (Signed)
D: Patient is aware of  Discharge this shift .  A Patient denies suicidal /homicidal ideations. Patient received all belongings brought in   No Storage medications. Writer reviewed Discharge Summary, Suicide Risk Assessment, and Transitional Record. Patient also received Prescriptions   from  MD. Aware  Of follow up appointment . And Letter for return to work  R: Patient left unit with no questions  Or concerns  With family

## 2018-09-05 NOTE — Plan of Care (Signed)
  Problem: Education: Goal: Knowledge of the prescribed therapeutic regimen will improve Outcome: Progressing  Patient is knowledgeable  and has insight of prescribed therapeutic regimen.

## 2018-09-05 NOTE — Progress Notes (Signed)
Patient alert and oriented x 4, affect is bright upon approach, interacting appropriately with peers and staff, her thoughts are organized and coherent rated depression a 4/10  ( 0 low- 10 high) she denies SI/HI/AVH, appears less anxious, very pleasant and polite with staff, making appropriate eye contact. Patient was complaint with medication regimen and she also attended wrap up group and was appropriate. Emotional support and encouragement given to staff, 15 minutes safety checks maintained will continue to monitor.

## 2018-09-05 NOTE — Progress Notes (Signed)
Recreation Therapy Notes  Date: 09/05/2018  Time: 9:30 am   Location: Craft room   Behavioral response: N/A   Intervention Topic: Problem Solving  Discussion/Intervention: Patient did not attend group.   Clinical Observations/Feedback:  Patient did not attend group.   Pearl Berlinger LRT/CTRS        Ann Jimenez 09/05/2018 11:00 AM

## 2018-09-05 NOTE — Progress Notes (Signed)
  [facility] Adult Case Management Discharge Plan :  Will you be returning to the same living situation after discharge:  Yes,  pt reports plans to return home. At discharge, do you have transportation home?: Yes,  pt parents will provide transportation. Do you have the ability to pay for your medications: Yes,  [employer]   Release of information consent forms completed and in the chart;  Patient's signature needed at discharge.  Patient to Follow up at: Follow-up Information    [facility] Follow up.   Why: Please follow up.  Appointment was unable to be scheduled but you can follow up with the print out information provided.   Contact information: [address] [place], [place] [address] P: [phone]          Next level of care provider has access to [place] and Suicide Prevention discussed: Yes,  SPE completed with the pt's husband.     Has patient been referred to the Quitline?: N/A patient is not a smoker  Patient has been referred for addiction treatment: [place], LCSW 09/05/2018, 9:33 AM

## 2018-09-10 DIAGNOSIS — Z9189 Other specified personal risk factors, not elsewhere classified: Secondary | ICD-10-CM | POA: Insufficient documentation

## 2018-10-01 ENCOUNTER — Other Ambulatory Visit: Payer: Self-pay | Admitting: Psychiatry

## 2019-01-01 ENCOUNTER — Other Ambulatory Visit: Payer: Self-pay | Admitting: Internal Medicine

## 2019-01-01 DIAGNOSIS — Z1231 Encounter for screening mammogram for malignant neoplasm of breast: Secondary | ICD-10-CM

## 2019-02-14 ENCOUNTER — Ambulatory Visit
Admission: RE | Admit: 2019-02-14 | Discharge: 2019-02-14 | Disposition: A | Discharge status: Home / Self Care | Payer: Managed Care, Other (non HMO) | Source: Ambulatory Visit | Attending: Internal Medicine | Admitting: Internal Medicine

## 2019-02-14 DIAGNOSIS — Z1231 Encounter for screening mammogram for malignant neoplasm of breast: Secondary | ICD-10-CM

## 2019-11-27 ENCOUNTER — Ambulatory Visit: Payer: Managed Care, Other (non HMO) | Admitting: Dermatology

## 2020-09-20 ENCOUNTER — Other Ambulatory Visit: Payer: Self-pay | Admitting: Internal Medicine

## 2020-09-20 DIAGNOSIS — Z1231 Encounter for screening mammogram for malignant neoplasm of breast: Secondary | ICD-10-CM

## 2020-09-30 ENCOUNTER — Other Ambulatory Visit: Payer: Self-pay

## 2020-09-30 ENCOUNTER — Ambulatory Visit
Admission: RE | Admit: 2020-09-30 | Discharge: 2020-09-30 | Disposition: A | Discharge status: Home / Self Care | Payer: Managed Care, Other (non HMO) | Source: Ambulatory Visit | Attending: Internal Medicine | Admitting: Internal Medicine

## 2020-09-30 DIAGNOSIS — Z1231 Encounter for screening mammogram for malignant neoplasm of breast: Secondary | ICD-10-CM | POA: Insufficient documentation

## 2020-10-12 DIAGNOSIS — Z8616 Personal history of COVID-19: Secondary | ICD-10-CM

## 2020-10-12 HISTORY — DX: Personal history of COVID-19: Z86.16

## 2021-01-10 ENCOUNTER — Other Ambulatory Visit: Payer: Self-pay | Admitting: Surgery

## 2021-01-17 ENCOUNTER — Encounter: Payer: Self-pay | Admitting: Surgery

## 2021-01-17 ENCOUNTER — Other Ambulatory Visit: Payer: Self-pay

## 2021-01-17 ENCOUNTER — Encounter
Admission: RE | Admit: 2021-01-17 | Discharge: 2021-01-17 | Disposition: A | Discharge status: Home / Self Care | Payer: Managed Care, Other (non HMO) | Source: Ambulatory Visit | Attending: Surgery | Admitting: Surgery

## 2021-01-17 ENCOUNTER — Encounter (HOSPITAL_COMMUNITY): Payer: Self-pay | Admitting: Urgent Care

## 2021-01-17 HISTORY — DX: Anxiety disorder, unspecified: F41.9

## 2021-01-17 NOTE — Patient Instructions (Signed)
Your procedure is scheduled on: 01/26/21 Report to DAY SURGERY DEPARTMENT LOCATED ON 2ND FLOOR MEDICAL MALL ENTRANCE. To find out your arrival time please call [phone] between 1PM - 3PM on 01/25/21.  Remember: Instructions that are not followed completely may result in serious medical risk, up to and including death, or upon the discretion of your surgeon and anesthesiologist your surgery may need to be rescheduled.     _X__ 1. Do not eat food after midnight the night before your procedure.                 No gum chewing or hard candies. You may drink clear liquids up to 2 hours                 before you are scheduled to arrive for your surgery- DO not drink clear                 liquids within 2 hours of the start of your surgery.                 Clear Liquids include:  water, apple juice without pulp, clear carbohydrate                 drink such as Clearfast or Gatorade, Black Coffee or Tea (Do not add                 anything to coffee or tea). Diabetics water only  __X__2.  On the morning of surgery brush your teeth with toothpaste and water, you                 may rinse your mouth with mouthwash if you wish.  Do not swallow any              toothpaste of mouthwash.     _X__ 3.  No Alcohol for 24 hours before or after surgery.   _X__ 4.  Do Not Smoke or use e-cigarettes For 24 Hours Prior to Your Surgery.                 Do not use any chewable tobacco products for at least 6 hours prior to                 surgery.  ____  5.  Bring all medications with you on the day of surgery if instructed.   __X__  6.  Notify your doctor if there is any change in your medical condition      (cold, fever, infections).     Do not wear jewelry, make-up, hairpins, clips or nail polish. Do not wear lotions, powders, or perfumes. No Deodorant Do not shave body hair 48 hours prior to surgery. Men may shave face and neck. Do not bring valuables to the hospital.    [facility] is not  responsible for any belongings or valuables.  Contacts, dentures/partials or body piercings may not be worn into surgery. Bring a case for your contacts, glasses or hearing aids, a denture cup will be supplied. Leave your suitcase in the car. After surgery it may be brought to your room. For patients admitted to the hospital, discharge time is determined by your treatment team.   Patients discharged the day of surgery will not be allowed to drive home.   Please read over the following fact sheets that you were given:   CHG soap  __X__ Take these medicines the morning of surgery with A SIP OF  WATER:    1. buPROPion (WELLBUTRIN XL) 150 MG 24 hr tablet  2. venlafaxine XR (EFFEXOR-XR) 150 MG 24 hr capsule  3. cetirizine (ZYRTEC) 10 MG tablet if needed  4. LORazepam (ATIVAN) 0.5 MG tablet if needed  5.  6.  ____ Fleet Enema (as directed)   __X__ Use CHG Soap/SAGE wipes as directed  ____ Use inhalers on the day of surgery  ____ Stop metformin/Janumet/Farxiga 2 days prior to surgery    ____ Take 1/2 of usual insulin dose the night before surgery. No insulin the morning          of surgery.   ____ Stop Blood Thinners Coumadin/Plavix/Xarelto/Pleta/Pradaxa/Eliquis/Effient/Aspirin  on    Or contact your Surgeon, Cardiologist or Medical Doctor regarding  ability to stop your blood thinners  __X__ Stop Anti-inflammatories 7 days before surgery such as Advil, Ibuprofen, Motrin,  BC or Goodies Powder, Naprosyn, Naproxen, Aleve, Aspirin   Stop taking Meloxicam May take Tylenol if needed  __X__ Stop all herbals and supplements, fish oil and vitamins until after surgery.    ____ Bring C-Pap to the hospital.

## 2021-02-16 ENCOUNTER — Other Ambulatory Visit: Payer: Managed Care, Other (non HMO)

## 2021-02-23 ENCOUNTER — Ambulatory Visit: Admit: 2021-02-23 | Payer: Managed Care, Other (non HMO) | Admitting: Surgery

## 2021-02-23 HISTORY — DX: Gastro-esophageal reflux disease without esophagitis: K21.9

## 2021-02-23 HISTORY — DX: Patient's other noncompliance with medication regimen for other reason: Z91.148

## 2021-02-23 HISTORY — DX: Hyperlipidemia, unspecified: E78.5

## 2021-02-23 HISTORY — DX: Migraine, unspecified, not intractable, without status migrainosus: G43.909

## 2021-02-23 HISTORY — DX: Patient's other noncompliance with medication regimen: Z91.14

## 2021-02-23 SURGERY — TENNIS ELBOW RELEASE/NIRSCHEL PROCEDURE
Anesthesia: Choice | Site: Elbow | Laterality: Right

## 2021-06-24 ENCOUNTER — Other Ambulatory Visit: Payer: Self-pay | Admitting: Student

## 2021-06-25 ENCOUNTER — Other Ambulatory Visit: Payer: Self-pay | Admitting: Student

## 2021-06-27 ENCOUNTER — Other Ambulatory Visit: Payer: Self-pay | Admitting: Student

## 2021-06-27 DIAGNOSIS — R590 Localized enlarged lymph nodes: Secondary | ICD-10-CM

## 2021-06-27 DIAGNOSIS — R1031 Right lower quadrant pain: Secondary | ICD-10-CM

## 2021-06-28 ENCOUNTER — Ambulatory Visit
Admission: RE | Admit: 2021-06-28 | Discharge: 2021-06-28 | Disposition: A | Discharge status: Home / Self Care | Payer: Managed Care, Other (non HMO) | Source: Ambulatory Visit | Attending: Student | Admitting: Student

## 2021-06-28 DIAGNOSIS — R590 Localized enlarged lymph nodes: Secondary | ICD-10-CM | POA: Insufficient documentation

## 2021-06-28 DIAGNOSIS — R1031 Right lower quadrant pain: Secondary | ICD-10-CM | POA: Insufficient documentation

## 2021-06-30 ENCOUNTER — Encounter: Payer: Self-pay | Admitting: Oncology

## 2021-06-30 ENCOUNTER — Inpatient Hospital Stay: Payer: Managed Care, Other (non HMO) | Attending: Oncology | Admitting: Oncology

## 2021-06-30 ENCOUNTER — Inpatient Hospital Stay: Payer: Managed Care, Other (non HMO)

## 2021-06-30 VITALS — BP 115/71 | HR 63 | Temp 96.8°F | Resp 18 | Ht 63.0 in | Wt 128.9 lb

## 2021-06-30 DIAGNOSIS — D72819 Decreased white blood cell count, unspecified: Secondary | ICD-10-CM

## 2021-06-30 DIAGNOSIS — D696 Thrombocytopenia, unspecified: Secondary | ICD-10-CM | POA: Diagnosis present

## 2021-06-30 DIAGNOSIS — F329 Major depressive disorder, single episode, unspecified: Secondary | ICD-10-CM

## 2021-06-30 DIAGNOSIS — F419 Anxiety disorder, unspecified: Secondary | ICD-10-CM | POA: Insufficient documentation

## 2021-06-30 DIAGNOSIS — D709 Neutropenia, unspecified: Secondary | ICD-10-CM

## 2021-06-30 DIAGNOSIS — Z803 Family history of malignant neoplasm of breast: Secondary | ICD-10-CM | POA: Diagnosis not present

## 2021-06-30 DIAGNOSIS — E876 Hypokalemia: Secondary | ICD-10-CM | POA: Diagnosis not present

## 2021-06-30 DIAGNOSIS — R5383 Other fatigue: Secondary | ICD-10-CM

## 2021-06-30 DIAGNOSIS — Z79899 Other long term (current) drug therapy: Secondary | ICD-10-CM

## 2021-06-30 DIAGNOSIS — F32A Depression, unspecified: Secondary | ICD-10-CM | POA: Diagnosis not present

## 2021-06-30 DIAGNOSIS — R110 Nausea: Secondary | ICD-10-CM | POA: Insufficient documentation

## 2021-06-30 LAB — COMPREHENSIVE METABOLIC PANEL
ALT: 32 U/L (ref 0–44)
AST: 26 U/L (ref 15–41)
Albumin: 4.5 g/dL (ref 3.5–5.0)
Alkaline Phosphatase: 66 U/L (ref 38–126)
Anion gap: 7 (ref 5–15)
BUN: 12 mg/dL (ref 8–23)
CO2: 31 mmol/L (ref 22–32)
Calcium: 8.9 mg/dL (ref 8.9–10.3)
Chloride: 99 mmol/L (ref 98–111)
Creatinine, Ser: 0.76 mg/dL (ref 0.44–1.00)
GFR, Estimated: >60 mL/min (ref >60)
Glucose, Bld: 98 mg/dL (ref 70–99)
Potassium: 2.9 mmol/L — ABNORMAL LOW (ref 3.5–5.1)
Sodium: 137 mmol/L (ref 135–145)
Total Bilirubin: 0.4 mg/dL (ref 0.3–1.2)
Total Protein: 7.9 g/dL (ref 6.5–8.1)

## 2021-06-30 LAB — CBC WITH DIFFERENTIAL/PLATELET
Abs Immature Granulocytes: 0.01 10*3/uL (ref 0.00–0.07)
Basophils Absolute: 0 10*3/uL (ref 0.0–0.1)
Basophils Relative: 1 %
Eosinophils Absolute: 0.1 10*3/uL (ref 0.0–0.5)
Eosinophils Relative: 1 %
HCT: 40.8 % (ref 36.0–46.0)
Hemoglobin: 13.4 g/dL (ref 12.0–15.0)
Immature Granulocytes: 0 %
Lymphocytes Relative: 43 %
Lymphs Abs: 1.8 10*3/uL (ref 0.7–4.0)
MCH: 28.9 pg (ref 26.0–34.0)
MCHC: 32.8 g/dL (ref 30.0–36.0)
MCV: 88.1 fL (ref 80.0–100.0)
Monocytes Absolute: 0.4 10*3/uL (ref 0.1–1.0)
Monocytes Relative: 9 %
Neutro Abs: 1.9 10*3/uL (ref 1.7–7.7)
Neutrophils Relative %: 46 %
Platelets: 204 10*3/uL (ref 150–400)
RBC: 4.63 MIL/uL (ref 3.87–5.11)
RDW: 13.2 % (ref 11.5–15.5)
WBC: 4.2 10*3/uL (ref 4.0–10.5)
nRBC: 0 % (ref 0.0–0.2)

## 2021-06-30 LAB — VITAMIN B12: Vitamin B-12: 465 pg/mL (ref 180–914)

## 2021-06-30 LAB — FOLATE: Folate: 15.3 ng/mL (ref >5.9)

## 2021-06-30 LAB — IRON AND TIBC
Iron: 57 ug/dL (ref 28–170)
Saturation Ratios: 16 % (ref 10.4–31.8)
TIBC: 351 ug/dL (ref 250–450)
UIBC: 294 ug/dL

## 2021-06-30 LAB — FERRITIN: Ferritin: 170 ng/mL (ref 11–307)

## 2021-06-30 NOTE — Progress Notes (Signed)
Pt reports poor appetite, fatigue and nausea but denies vomiting. ?

## 2021-07-01 NOTE — Progress Notes (Signed)
?[facility]  ?[phone] [phone] ? ?ID: Ann Jimenez OB: Dec 01, 1960  MR#: [id number]  OVF#:[id number] ? ?Patient Care Team: ?Rusty Aus, MD as PCP - General (Internal Medicine) ? ?CHIEF COMPLAINT: Leukopenia and thrombocytopenia. ? ?INTERVAL HISTORY: Patient is a 61 year old female who was noted to have a persistent leukopenia and new onset thrombocytopenia on routine blood work.  She has increased fatigue and occasional nausea, but otherwise feels well.  She has no neurologic complaints.  She denies any recent fevers or illnesses.  She has no new medications.  She has a fair appetite, but denies weight loss.  She has no chest pain, shortness of breath, cough, or hemoptysis.  She denies any vomiting, constipation, or diarrhea.  She has no urinary complaints.  Patient offers no further specific complaints today. ? ?REVIEW OF SYSTEMS:   ?Review of Systems  ?Constitutional:  Positive for malaise/fatigue. Negative for fever and weight loss.  ?Respiratory: Negative.  Negative for cough, hemoptysis and shortness of breath.   ?Cardiovascular: Negative.  Negative for chest pain and leg swelling.  ?Gastrointestinal:  Positive for nausea. Negative for abdominal pain.  ?Genitourinary: Negative.  Negative for dysuria.  ?Musculoskeletal: Negative.  Negative for back pain.  ?Skin: Negative.  Negative for rash.  ?Neurological: Negative.  Negative for dizziness, focal weakness, weakness and headaches.  ?Psychiatric/Behavioral: Negative.  The patient is not nervous/anxious.   ? ?As per HPI. Otherwise, a complete review of systems is negative. ? ?PAST MEDICAL HISTORY: ?Past Medical History:  ?Diagnosis Date  ? Anxiety   ? Depression   ? GERD (gastroesophageal reflux disease)   ? History of 2019 novel coronavirus disease (COVID-19) 10/12/2020  ? HLD (hyperlipidemia)   ? Migraines   ? Overuse of medication   ? a.) documented episodes of excessive Rx medication use (outside of  prescribed parameters), with the main agent being zolpidem.  ? Suicidal ideations 08/31/2018  ? a.) SI with (+) attempt by intentional drug OD (APAP x ? + etodolac x 15 + zolpidem x 10 + venlafaxine x 3 + valacyclovir x 6) following verbal altercation with son. Admitted to inpatient psychiatriac unit 09/03/2018 - 09/05/2018.  ? ? ?PAST SURGICAL HISTORY: ?Past Surgical History:  ?Procedure Laterality Date  ? AUGMENTATION MAMMAPLASTY  2004  ? BREAST CYST EXCISION  02/2016  ? TUBAL LIGATION  1997  ? ? ?FAMILY HISTORY: ?Family History  ?Problem Relation Age of Onset  ? Breast cancer Mother 35  ? ? ?ADVANCED DIRECTIVES (Y/N):  N ? ?HEALTH MAINTENANCE: ?Social History  ? ?Tobacco Use  ? Smoking status: Never  ? Smokeless tobacco: Never  ?Vaping Use  ? Vaping Use: Never used  ?Substance Use Topics  ? Alcohol use: No  ? Drug use: No  ? ? ? Colonoscopy: ? PAP: ? Bone density: ? Lipid panel: ? ?Allergies  ?Allergen Reactions  ? Zolpidem Other (See Comments)  ?  Hallucinations  ?  ? ? ?Current Outpatient Medications  ?Medication Sig Dispense Refill  ? buPROPion (WELLBUTRIN XL) 150 MG 24 hr tablet Take 150 mg by mouth daily.    ? Calcium Carbonate-Vit D-Min (CALCIUM 1200 PO) Take 1 tablet by mouth at bedtime.    ? celecoxib (CELEBREX) 100 MG capsule Take 100 mg by mouth 2 (two) times daily.    ? cetirizine (ZYRTEC) 10 MG tablet Take 10 mg by mouth as needed for allergies.    ? LORazepam (ATIVAN) 0.5 MG tablet Take 0.5 mg by mouth daily as needed  for anxiety.    ? traZODone (DESYREL) 50 MG tablet Take 50 mg by mouth at bedtime.    ? venlafaxine XR (EFFEXOR-XR) 150 MG 24 hr capsule TAKE 1 CAPSULE EVERY DAY WITH BREAKFAST 30 capsule 1  ? ?No current facility-administered medications for this visit.  ? ? ?OBJECTIVE: ?Vitals:  ? 06/30/21 1325  ?BP: 115/71  ?Pulse: 63  ?Resp: 18  ?Temp: (!) 96.8 ?F (36 ?C)  ?SpO2: 100%  ?   Body mass index is 22.83 kg/m?Marland Kitchen    ECOG FS:0 - Asymptomatic ? ?General: Well-developed, well-nourished, no  acute distress. ?Eyes: Pink conjunctiva, anicteric sclera. ?HEENT: Normocephalic, moist mucous membranes. ?Lungs: No audible wheezing or coughing. ?Heart: Regular rate and rhythm. ?Abdomen: Soft, nontender, no obvious distention. ?Musculoskeletal: No edema, cyanosis, or clubbing. ?Neuro: Alert, answering all questions appropriately. Cranial nerves grossly intact. ?Skin: No rashes or petechiae noted. ?Psych: Normal affect. ?Lymphatics: No cervical, calvicular, axillary or inguinal LAD. ? ? ?LAB RESULTS: ? ?Lab Results  ?Component Value Date  ? NA 137 06/30/2021  ? K 2.9 (L) 06/30/2021  ? CL 99 06/30/2021  ? CO2 31 06/30/2021  ? GLUCOSE 98 06/30/2021  ? BUN 12 06/30/2021  ? CREATININE 0.76 06/30/2021  ? CALCIUM 8.9 06/30/2021  ? PROT 7.9 06/30/2021  ? ALBUMIN 4.5 06/30/2021  ? AST 26 06/30/2021  ? ALT 32 06/30/2021  ? ALKPHOS 66 06/30/2021  ? BILITOT 0.4 06/30/2021  ? GFRNONAA >60 06/30/2021  ? GFRAA >60 09/02/2018  ? ? ?Lab Results  ?Component Value Date  ? WBC 4.2 06/30/2021  ? NEUTROABS 1.9 06/30/2021  ? HGB 13.4 06/30/2021  ? HCT 40.8 06/30/2021  ? MCV 88.1 06/30/2021  ? PLT 204 06/30/2021  ? ? ? ?STUDIES: ?[place] RT LOWER EXTREM LTD SOFT TISSUE NON VASCULAR ? ?Result Date: 06/29/2021 ?CLINICAL DATA:  Inguinal adenopathy detected last Friday, RIGHT inguinal pain EXAM: ULTRASOUND RIGHT LOWER EXTREMITY LIMITED TECHNIQUE: Ultrasound examination of the lower extremity soft tissues was performed in the area of clinical concern. COMPARISON:  None FINDINGS: Normal sized RIGHT inguinal lymph nodes noted. No enlarged or abnormal appearing RIGHT inguinal lymph nodes. No hernia, mass or abnormal fluid collection. IMPRESSION: Normal sized RIGHT inguinal lymph nodes. Otherwise negative exam. Electronically Signed   By: Lavonia Dana M.D.   On: 06/29/2021 14:24   ? ?ASSESSMENT: Leukopenia and thrombocytopenia. ? ?PLAN:   ? ?Leukopenia and thrombocytopenia: Resolved.  Unclear, transient drop of white blood cell count and platelet  count.  Iron stores, B12, and folate are all also within normal limits.  Antineutrophil antibodies, platelet antibodies, flow cytometry, and IntelliGEN Myeloid panel were ordered for completeness and are pending at time of dictation.  No intervention is needed.  Patient does not require bone marrow biopsy at this time.  She will have a video assisted telemedicine visit in 2 weeks for further evaluation and discussion of her results. ?Hypokalemia: Recommended dietary changes. ? ?I spent a total of 45 minutes reviewing chart data, face-to-face evaluation with the patient, counseling and coordination of care as detailed above. ? ? ?Patient expressed understanding and was in agreement with this plan. She also understands that She can call clinic at any time with any questions, concerns, or complaints.  ? ? ?Lloyd Huger, MD   07/01/2021 2:14 PM ? ? ? ? ?

## 2021-07-02 LAB — PLATELET ANTIBODY PROFILE
Glycoprotein IV Antibody: NEGATIVE
HLA Ab Ser Ql EIA: NEGATIVE
IA/IIA Antibody: NEGATIVE
IB/IX Antibody: NEGATIVE
IIB/IIIA Antibody: NEGATIVE

## 2021-07-04 LAB — COMP PANEL: LEUKEMIA/LYMPHOMA: Immunophenotypic Profile: 0

## 2021-07-05 ENCOUNTER — Encounter: Payer: Self-pay | Admitting: Oncology

## 2021-07-07 LAB — NEUTROPHIL AB TEST LEVEL 1: NEUTROPHIL SCR/PANEL INTERP.: NEGATIVE

## 2021-07-09 NOTE — Progress Notes (Signed)
?[facility]  ?Telephone:([phone]) [phone] [phone] ? ?ID: Ann Jimenez OB: 1960/11/15  MR#: [id number]  BSW#:[id number] ? ?Patient Care Team: ?Rusty Aus, MD as PCP - General (Internal Medicine) ? ?I connected with Ann Jimenez on 07/15/21 at 10:30 AM EDT by video enabled telemedicine visit and verified that I am speaking with the correct person using two identifiers.  ? ?I discussed the limitations, risks, security and privacy concerns of performing an evaluation and management service by telemedicine and the availability of in-person appointments. I also discussed with the patient that there may be a patient responsible charge related to this service. The patient expressed understanding and agreed to proceed.  ? ?Other persons participating in the visit and their role in the encounter: Patient, MD. ? ?Patient?s location: Home. ?Provider?s location: Clinic. ? ?CHIEF COMPLAINT: Monoclonal B-cell lymphocytosis of undetermined significance ? ?INTERVAL HISTORY: Patient agreed to video assisted telemedicine visit for further evaluation and discussion of her laboratory results.  She continues to feel well and remains asymptomatic.  She does not complain of any weakness or fatigue today.  She has no neurologic complaints.  She denies any recent fevers or illnesses.  She has a fair appetite, but denies weight loss.  She has no chest pain, shortness of breath, cough, or hemoptysis.  She denies any vomiting, constipation, or diarrhea.  She has no urinary complaints.  Patient offers no specific complaints today. ? ?REVIEW OF SYSTEMS:   ?Review of Systems  ?Constitutional: Negative.  Negative for fever, malaise/fatigue and weight loss.  ?Respiratory: Negative.  Negative for cough, hemoptysis and shortness of breath.   ?Cardiovascular: Negative.  Negative for chest pain and leg swelling.  ?Gastrointestinal: Negative.  Negative for abdominal pain and nausea.  ?Genitourinary: Negative.   Negative for dysuria.  ?Musculoskeletal: Negative.  Negative for back pain.  ?Skin: Negative.  Negative for rash.  ?Neurological: Negative.  Negative for dizziness, focal weakness, weakness and headaches.  ?Psychiatric/Behavioral: Negative.  The patient is not nervous/anxious.   ? ?As per HPI. Otherwise, a complete review of systems is negative. ? ?PAST MEDICAL HISTORY: ?Past Medical History:  ?Diagnosis Date  ? Anxiety   ? Depression   ? GERD (gastroesophageal reflux disease)   ? History of 2019 novel coronavirus disease (COVID-19) 10/12/2020  ? HLD (hyperlipidemia)   ? Migraines   ? Overuse of medication   ? a.) documented episodes of excessive Rx medication use (outside of prescribed parameters), with the main agent being zolpidem.  ? Suicidal ideations 08/31/2018  ? a.) SI with (+) attempt by intentional drug OD (APAP x ? + etodolac x 15 + zolpidem x 10 + venlafaxine x 3 + valacyclovir x 6) following verbal altercation with son. Admitted to inpatient psychiatriac unit 09/03/2018 - 09/05/2018.  ? ? ?PAST SURGICAL HISTORY: ?Past Surgical History:  ?Procedure Laterality Date  ? AUGMENTATION MAMMAPLASTY  2004  ? BREAST CYST EXCISION  02/2016  ? TUBAL LIGATION  1997  ? ? ?FAMILY HISTORY: ?Family History  ?Problem Relation Age of Onset  ? Breast cancer Mother 31  ? ? ?ADVANCED DIRECTIVES (Y/N):  N ? ?HEALTH MAINTENANCE: ?Social History  ? ?Tobacco Use  ? Smoking status: Never  ? Smokeless tobacco: Never  ?Vaping Use  ? Vaping Use: Never used  ?Substance Use Topics  ? Alcohol use: No  ? Drug use: No  ? ? ? Colonoscopy: ? PAP: ? Bone density: ? Lipid panel: ? ?Allergies  ?Allergen Reactions  ? Zolpidem Other (See  Comments)  ?  Hallucinations  ?  ? ? ?Current Outpatient Medications  ?Medication Sig Dispense Refill  ? buPROPion (WELLBUTRIN XL) 150 MG 24 hr tablet Take 150 mg by mouth daily.    ? Calcium Carbonate-Vit D-Min (CALCIUM 1200 PO) Take 1 tablet by mouth at bedtime.    ? celecoxib (CELEBREX) 100 MG capsule Take  100 mg by mouth 2 (two) times daily.    ? cetirizine (ZYRTEC) 10 MG tablet Take 10 mg by mouth as needed for allergies.    ? LORazepam (ATIVAN) 0.5 MG tablet Take 0.5 mg by mouth daily as needed for anxiety.    ? traZODone (DESYREL) 50 MG tablet Take 50 mg by mouth at bedtime.    ? venlafaxine XR (EFFEXOR-XR) 150 MG 24 hr capsule TAKE 1 CAPSULE EVERY DAY WITH BREAKFAST 30 capsule 1  ? ?No current facility-administered medications for this visit.  ? ? ?OBJECTIVE: ?There were no vitals filed for this visit. ?   There is no height or weight on file to calculate BMI.    ECOG FS:0 - Asymptomatic ? ?General: Well-developed, well-nourished, no acute distress. ?HEENT: Normocephalic. ?Neuro: Alert, answering all questions appropriately. Cranial nerves grossly intact. ?Psych: Normal affect. ? ? ?LAB RESULTS: ? ?Lab Results  ?Component Value Date  ? NA 137 06/30/2021  ? K 2.9 (L) 06/30/2021  ? CL 99 06/30/2021  ? CO2 31 06/30/2021  ? GLUCOSE 98 06/30/2021  ? BUN 12 06/30/2021  ? CREATININE 0.76 06/30/2021  ? CALCIUM 8.9 06/30/2021  ? PROT 7.9 06/30/2021  ? ALBUMIN 4.5 06/30/2021  ? AST 26 06/30/2021  ? ALT 32 06/30/2021  ? ALKPHOS 66 06/30/2021  ? BILITOT 0.4 06/30/2021  ? GFRNONAA >60 06/30/2021  ? GFRAA >60 09/02/2018  ? ? ?Lab Results  ?Component Value Date  ? WBC 4.2 06/30/2021  ? NEUTROABS 1.9 06/30/2021  ? HGB 13.4 06/30/2021  ? HCT 40.8 06/30/2021  ? MCV 88.1 06/30/2021  ? PLT 204 06/30/2021  ? ? ? ?STUDIES: ?[place] RT LOWER EXTREM LTD SOFT TISSUE NON VASCULAR ? ?Result Date: 06/29/2021 ?CLINICAL DATA:  Inguinal adenopathy detected last Friday, RIGHT inguinal pain EXAM: ULTRASOUND RIGHT LOWER EXTREMITY LIMITED TECHNIQUE: Ultrasound examination of the lower extremity soft tissues was performed in the area of clinical concern. COMPARISON:  None FINDINGS: Normal sized RIGHT inguinal lymph nodes noted. No enlarged or abnormal appearing RIGHT inguinal lymph nodes. No hernia, mass or abnormal fluid collection. IMPRESSION: Normal  sized RIGHT inguinal lymph nodes. Otherwise negative exam. Electronically Signed   By: Lavonia Dana M.D.   On: 06/29/2021 14:24   ? ?ASSESSMENT: Monoclonal B-cell lymphocytosis of undetermined significance. ? ?PLAN:   ? ?Monoclonal B-cell lymphocytosis of undetermined significance: Patient noted to have a clonal B-cell population and less than 1% of her cells on peripheral blood flow cytometry.  No intervention is needed.  Patient does not require bone marrow biopsy.  Recommend monitoring CBC 1-2 times per year.  If patient begins to develop a persistent leukocytosis or lymphocytosis can refer patient back for further evaluation and repeat flow cytometry.  No follow-up has been scheduled. ?Leukopenia and thrombocytopenia: Resolved.  Unclear, transient drop of white blood cell count and platelet count.  All other laboratory work other than flow cytometry was either negative or within normal limits including iron stores, B12, antineutrophil antibodies, platelet antibodies, and IntelliGEN Myeloid panel.   ?Hypokalemia: Previously recommended dietary changes. ? ?I provided 20 minutes of face-to-face video visit time during this encounter which included  chart review, counseling, and coordination of care as documented above. ? ? ? ?Patient expressed understanding and was in agreement with this plan. She also understands that She can call clinic at any time with any questions, concerns, or complaints.  ? ? ?Lloyd Huger, MD   07/15/2021 6:32 AM ? ? ? ? ?

## 2021-07-12 LAB — INTELLIGEN MYELOID

## 2021-07-14 ENCOUNTER — Inpatient Hospital Stay: Payer: Managed Care, Other (non HMO) | Attending: Oncology | Admitting: Oncology

## 2021-07-14 DIAGNOSIS — D696 Thrombocytopenia, unspecified: Secondary | ICD-10-CM | POA: Diagnosis not present

## 2021-07-14 DIAGNOSIS — D709 Neutropenia, unspecified: Secondary | ICD-10-CM

## 2021-07-14 NOTE — Progress Notes (Signed)
Pt reports improvement in symptoms. No longer c/o poor appetite, poor sleep, or n/v. States she "Feels much better and back to normal." ?

## 2021-09-30 DIAGNOSIS — R7303 Prediabetes: Secondary | ICD-10-CM | POA: Insufficient documentation

## 2022-01-18 ENCOUNTER — Other Ambulatory Visit: Payer: Self-pay | Admitting: Internal Medicine

## 2022-01-18 DIAGNOSIS — Z1231 Encounter for screening mammogram for malignant neoplasm of breast: Secondary | ICD-10-CM

## 2022-02-14 ENCOUNTER — Other Ambulatory Visit (HOSPITAL_COMMUNITY): Payer: Self-pay | Admitting: Neurology

## 2022-02-14 DIAGNOSIS — R200 Anesthesia of skin: Secondary | ICD-10-CM

## 2022-02-14 DIAGNOSIS — R4189 Other symptoms and signs involving cognitive functions and awareness: Secondary | ICD-10-CM

## 2022-02-24 ENCOUNTER — Ambulatory Visit (HOSPITAL_COMMUNITY)
Admission: RE | Admit: 2022-02-24 | Discharge: 2022-02-24 | Disposition: A | Discharge status: Home / Self Care | Payer: Managed Care, Other (non HMO) | Source: Ambulatory Visit | Attending: Neurology | Admitting: Neurology

## 2022-02-24 DIAGNOSIS — R4189 Other symptoms and signs involving cognitive functions and awareness: Secondary | ICD-10-CM | POA: Diagnosis present

## 2022-02-24 DIAGNOSIS — R200 Anesthesia of skin: Secondary | ICD-10-CM | POA: Insufficient documentation

## 2022-03-24 ENCOUNTER — Ambulatory Visit
Admission: RE | Admit: 2022-03-24 | Discharge: 2022-03-24 | Disposition: A | Discharge status: Home / Self Care | Payer: Managed Care, Other (non HMO) | Source: Ambulatory Visit | Attending: Internal Medicine | Admitting: Internal Medicine

## 2022-03-24 DIAGNOSIS — Z1231 Encounter for screening mammogram for malignant neoplasm of breast: Secondary | ICD-10-CM

## 2022-06-15 ENCOUNTER — Encounter: Payer: Self-pay | Admitting: Dermatology

## 2022-06-15 ENCOUNTER — Ambulatory Visit: Payer: Managed Care, Other (non HMO) | Admitting: Dermatology

## 2022-06-15 VITALS — BP 138/80 | HR 67

## 2022-06-15 DIAGNOSIS — L578 Other skin changes due to chronic exposure to nonionizing radiation: Secondary | ICD-10-CM | POA: Diagnosis not present

## 2022-06-15 DIAGNOSIS — L719 Rosacea, unspecified: Secondary | ICD-10-CM | POA: Diagnosis not present

## 2022-06-15 DIAGNOSIS — L821 Other seborrheic keratosis: Secondary | ICD-10-CM

## 2022-06-15 NOTE — Progress Notes (Signed)
   New Patient Visit   Subjective  Ann Jimenez is a 62 y.o. female who presents for the following: Spots. Few lesions of concern on face. Non tender, denies bleeding.   The patient has spots, moles and lesions to be evaluated, some may be new or changing and the patient has concerns that these could be cancer.   The following portions of the chart were reviewed this encounter and updated as appropriate: medications, allergies, medical history  Review of Systems:  No other skin or systemic complaints except as noted in HPI or Assessment and Plan.  Objective  Well appearing patient in no apparent distress; mood and affect are within normal limits.  A focused examination was performed of the following areas: face Relevant physical exam findings are noted in the Assessment and Plan.    Assessment & Plan   SEBORRHEIC KERATOSIS. Nose, face - Stuck-on, waxy, tan-brown papules and/or plaques  - Benign-appearing - Discussed benign etiology and prognosis. - Observe - Call for any changes   ACTINIC DAMAGE - chronic, secondary to cumulative UV radiation exposure/sun exposure over time - diffuse scaly erythematous macules with underlying dyspigmentation - Recommend daily broad spectrum sunscreen SPF 30+ to sun-exposed areas, reapply every 2 hours as needed.  - Recommend staying in the shade or wearing long sleeves, sun glasses (UVA+UVB protection) and wide brim hats (4-inch brim around the entire circumference of the hat). - Call for new or changing lesions.    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   Rosacea is a chronic progressive skin condition usually affecting the face of adults, causing redness and/or acne bumps. It is treatable but not curable. It sometimes affects the eyes (ocular rosacea) as well. It may respond to topical and/or systemic medication and can flare  with stress, sun exposure, alcohol, exercise, topical steroids (including hydrocortisone/cortisone 10) and some foods.  Daily application of broad spectrum spf 30+ sunscreen to face is recommended to reduce flares.  Denies grittiness of eyes.  Treatment Plan  Discussed rx Rhofade, Patient deferred treatment at this time.  Counseling for BBL / IPL / Laser and Coordination of Care Discussed the treatment option of Broad Band Light (BBL) /Intense Pulsed Light (IPL)/ Laser for skin discoloration, including brown spots and redness.  Typically we recommend at least 1-3 treatment sessions about 5-8 weeks apart for best results.  Cannot have tanned skin when BBL performed, and regular use of sunscreen is advised after the procedure to help maintain results. The patient's condition may also require "maintenance treatments" in the future.  The fee for BBL / laser treatments is $350 per treatment session for the whole face.  A fee can be quoted for other parts of the body.  Insurance typically does not pay for BBL/laser treatments and therefore the fee is an out-of-pocket cost.    Return if symptoms worsen or fail to improve.  I, Lawson Radar, CMA, am acting as scribe for Darden Dates, MD.   Documentation: I have reviewed the above documentation for accuracy and completeness, and I agree with the above.  Darden Dates, MD

## 2022-06-15 NOTE — Patient Instructions (Addendum)
Counseling for BBL / IPL / Laser and Coordination of Care Discussed the treatment option of Broad Band Light (BBL) /Intense Pulsed Light (IPL)/ Laser for skin discoloration, including brown spots and redness.  Typically we recommend at least 1-3 treatment sessions about 5-8 weeks apart for best results.  Cannot have tanned skin when BBL performed, and regular use of sunscreen is advised after the procedure to help maintain results. The patient's condition may also require "maintenance treatments" in the future.  The fee for BBL / laser treatments is $350 per treatment session for the whole face.  A fee can be quoted for other parts of the body.  Insurance typically does not pay for BBL/laser treatments and therefore the fee is an out-of-pocket cost.    Recommend daily broad spectrum sunscreen SPF 30+ to sun-exposed areas, reapply every 2 hours as needed. Call for new or changing lesions.  Staying in the shade or wearing long sleeves, sun glasses (UVA+UVB protection) and wide brim hats (4-inch brim around the entire circumference of the hat) are also recommended for sun protection.    Recommend taking Heliocare sun protection supplement daily in sunny weather for additional sun protection. For maximum protection on the sunniest days, you can take up to 2 capsules of regular Heliocare OR take 1 capsule of Heliocare Ultra. For prolonged exposure (such as a full day in the sun), you can repeat your dose of the supplement 4 hours after your first dose. Heliocare can be purchased at [employer], at some [facility] or at [email or web address].    Basic OTC daily skin care regimen to prevent photoaging:   Recommend facial moisturizer with sunscreen SPF 30 every morning (OTC brands include CeraVe AM, Neutrogena, Eucerin, Cetaphil, Aveeno, La Roche Posay).  Can also apply a topical Vit C serum which is an antioxidant (OTC brands include CeraVe, La Roche Posay, and The Ordinary) underneath sunscreen in  morning. If you are outside during the day in the summer for extended periods, especially swimming and/or sweating, make sure you apply a water resistant facial sunscreen lotion spf 30 or higher.   At night recommend a cream with retinol (a vitamin A derivative which stimulates collagen production) like CeraVe skin renewing retinol serum or ROC retinol correxion cream or Neutrogena rapid wrinkle repair cream, The Perfect A is available here. Retinol may cause skin irritation in people with sensitive skin.  Can use it every other day and/or apply on top of a hyaluronic acid (HA) moisturizer/serum (Neutrogena Hydroboost water cream) if better tolerated that way.  Retinol may also help with lightening brown spots.   Our office sells high quality, medically tested skin care lines such as Elta MD sunscreens (with Zinc), and Alastin skin care products, which are very effective in treating photoaging. The Alastin line includes cosmeceutical grade Vit.C serum, HA serum, Elastin stimulating moisturizers/serums, lightening serum, and sunscreens.  If you want prescription treatment, then you would need an appointment (Rx tretinoin and fade creams, Botox, filler injections, laser treatments, etc.) These prescriptions and procedures are not covered by insurance but work very well.    Due to recent changes in healthcare laws, you may see results of your pathology and/or laboratory studies on MyChart before the doctors have had a chance to review them. We understand that in some cases there may be results that are confusing or concerning to you. Please understand that not all results are received at the same time and often the doctors may need to interpret multiple results in  order to provide you with the best plan of care or course of treatment. Therefore, we ask that you please give [place] 2 business days to thoroughly review all your results before contacting the office for clarification. Should we see a critical lab  result, you will be contacted sooner.   If You Need Anything After Your Visit  If you have any questions or concerns for your doctor, please call our main line at [phone] and press option 4 to reach your doctor's medical assistant. If no one answers, please leave a voicemail as directed and we will return your call as soon as possible. Messages left after 4 pm will be answered the following business day.   You may also send [place] a message via [place]. We typically respond to MyChart messages within 1-2 business days.  For prescription refills, please ask your pharmacy to contact our office. Our fax number is [phone].  If you have an urgent issue when the clinic is closed that cannot wait until the next business day, you can page your doctor at the number below.    Please note that while we do our best to be available for urgent issues outside of office hours, we are not available 24/7.   If you have an urgent issue and are unable to reach [place], you may choose to seek medical care at your doctor's office, retail clinic, urgent care center, or emergency room.  If you have a medical emergency, please immediately call 911 or go to the emergency department.  Pager Numbers  - Dr. Nehemiah Massed: [phone]  - Dr. Laurence Ferrari: [phone]  - Dr. Nicole Kindred: [phone]  In the event of inclement weather, please call our main line at [phone] for an update on the status of any delays or closures.  Dermatology Medication Tips: Please keep the boxes that topical medications come in in order to help keep track of the instructions about where and how to use these. Pharmacies typically print the medication instructions only on the boxes and not directly on the medication tubes.   If your medication is too expensive, please contact our office at [phone] option 4 or send [place] a message through [place].   We are unable to tell what your co-pay for medications will be in advance as this is  different depending on your insurance coverage. However, we may be able to find a substitute medication at lower cost or fill out paperwork to get insurance to cover a needed medication.   If a prior authorization is required to get your medication covered by your insurance company, please allow [place] 1-2 business days to complete this process.  Drug prices often vary depending on where the prescription is filled and some pharmacies may offer cheaper prices.  The website www.goodrx.com contains coupons for medications through different pharmacies. The prices here do not account for what the cost may be with help from insurance (it may be cheaper with your insurance), but the website can give you the price if you did not use any insurance.  - You can print the associated coupon and take it with your prescription to the pharmacy.  - You may also stop by our office during regular business hours and pick up a GoodRx coupon card.  - If you need your prescription sent electronically to a different pharmacy, notify our office through [facility] or by phone at [phone] option 4.     Si Usted Necesita Algo Despus de Su Visita  Tambin puede enviarnos  un mensaje a travs de MyChart. Por lo general respondemos a los mensajes de MyChart en el transcurso de 1 a 2 das hbiles.  Para renovar recetas, por favor pida a su farmacia que se ponga en contacto con nuestra oficina. Harland Dingwall de fax es [place] [phone].  Si tiene un asunto urgente cuando la clnica est cerrada y que no puede esperar hasta el siguiente da hbil, puede llamar/localizar a su doctor(a) al nmero que aparece a continuacin.   Por favor, tenga en cuenta que aunque hacemos todo lo posible para estar disponibles para asuntos urgentes fuera del horario de [place], no estamos disponibles las 24 horas del da, los 7 das de la [place].   Si tiene un problema urgente y no puede comunicarse con nosotros, puede optar por buscar  atencin mdica  en el consultorio de su doctor(a), en una clnica privada, en un centro de atencin urgente o en una sala de emergencias.  Si tiene Engineering geologist, por favor llame inmediatamente al 911 o vaya a la sala de emergencias.  Nmeros de bper  - Dr. Nehemiah Massed: [phone]  - Dra. Moye: [phone]  - Dra. Nicole Kindred: [phone]  En caso de inclemencias del [place], por favor llame a Johnsie Kindred principal al [phone] para una actualizacin sobre el [place] de cualquier retraso o cierre.  Consejos para la medicacin en dermatologa: Por favor, guarde las cajas en las que vienen los medicamentos de uso tpico para ayudarle a seguir las instrucciones sobre dnde y cmo usarlos. Las farmacias generalmente imprimen las instrucciones del medicamento slo en las cajas y no directamente en los tubos del [place].   Si su medicamento es muy caro, por favor, pngase en contacto con Zigmund Daniel llamando al [phone] y presione la opcin 4 o envenos un mensaje a travs de Pharmacist, community.   No podemos decirle cul ser su copago por los medicamentos por adelantado ya que esto es diferente dependiendo de la cobertura de su seguro. Sin embargo, es posible que podamos encontrar un medicamento sustituto a Electrical engineer un formulario para que el seguro cubra el medicamento que se considera necesario.   Si se requiere una autorizacin previa para que su compaa de seguros [place] su medicamento, por favor permtanos de 1 a 2 das hbiles para completar este proceso.  Los precios de los medicamentos varan con frecuencia dependiendo del Environmental consultant de dnde se surte la receta y alguna farmacias pueden ofrecer precios ms baratos.  El sitio web www.goodrx.com tiene cupones para medicamentos de Airline pilot. Los precios aqu no tienen en cuenta lo que podra costar con la ayuda del seguro (puede ser ms barato con su seguro), pero el sitio web puede darle el precio si no utiliz  Research scientist (physical sciences).  - Puede imprimir el cupn correspondiente y llevarlo con su receta a la farmacia.  - Tambin puede pasar por nuestra oficina durante el horario de atencin regular y Charity fundraiser una tarjeta de cupones de GoodRx.  - Si necesita que su receta se enve electrnicamente a una farmacia diferente, informe a nuestra oficina a travs de MyChart de [place] o por telfono llamando al [phone] y presione la opcin 4.

## 2022-07-20 DIAGNOSIS — M810 Age-related osteoporosis without current pathological fracture: Secondary | ICD-10-CM | POA: Insufficient documentation

## 2022-11-14 ENCOUNTER — Other Ambulatory Visit: Payer: Self-pay

## 2022-11-14 MED ORDER — GABAPENTIN 300 MG PO CAPS: 300.0000 mg | ORAL_CAPSULE | Freq: Two times a day (BID) | ORAL | 2 refills | Status: AC

## 2022-11-14 MED ORDER — BUPROPION HCL ER (XL) 300 MG PO TB24
300.0000 mg | ORAL_TABLET | Freq: Every day | ORAL | 11 refills | Status: DC
  Filled 2022-11-14 – 2023-01-15 (×2): qty 30, 30d supply, fill #0
  Filled 2023-02-09: qty 30, 30d supply, fill #1
  Filled 2023-03-30: qty 30, 30d supply, fill #2
  Filled 2023-04-16: qty 30, 30d supply, fill #3
  Filled 2023-05-28: qty 30, 30d supply, fill #4
  Filled 2023-06-25: qty 30, 30d supply, fill #5
  Filled 2023-07-24: qty 30, 30d supply, fill #6

## 2022-11-14 MED ORDER — BUSPIRONE HCL 10 MG PO TABS
10.0000 mg | ORAL_TABLET | Freq: Two times a day (BID) | ORAL | 5 refills | Status: AC
  Filled 2022-11-27: qty 60, 30d supply, fill #0
  Filled 2023-01-02: qty 60, 30d supply, fill #1
  Filled 2023-03-30: qty 60, 30d supply, fill #2
  Filled 2023-07-24: qty 60, 30d supply, fill #3

## 2022-11-17 IMAGING — MG DIGITAL SCREENING BREAST BILAT IMPLANT W/ TOMO W/ CAD
8 of 12 series · 8 of 28 positions shown · non-contrast
Comparison: Previous exam(s).

CLINICAL DATA: Screening.

EXAM:
DIGITAL SCREENING BILATERAL MAMMOGRAM WITH IMPLANTS, CAD AND
TOMOSYNTHESIS
TECHNIQUE: Bilateral screening digital craniocaudal and mediolateral oblique
mammograms were obtained. Bilateral screening digital breast
tomosynthesis was performed. The images were evaluated with
computer-aided detection. Standard and/or implant displaced views
were performed.

[L MLO]
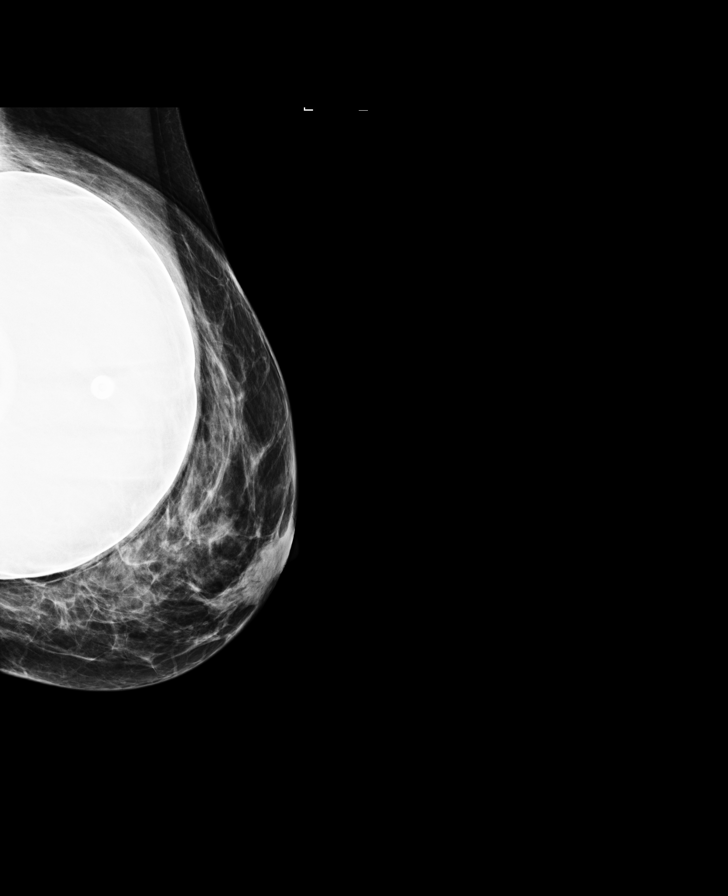

[L CC]
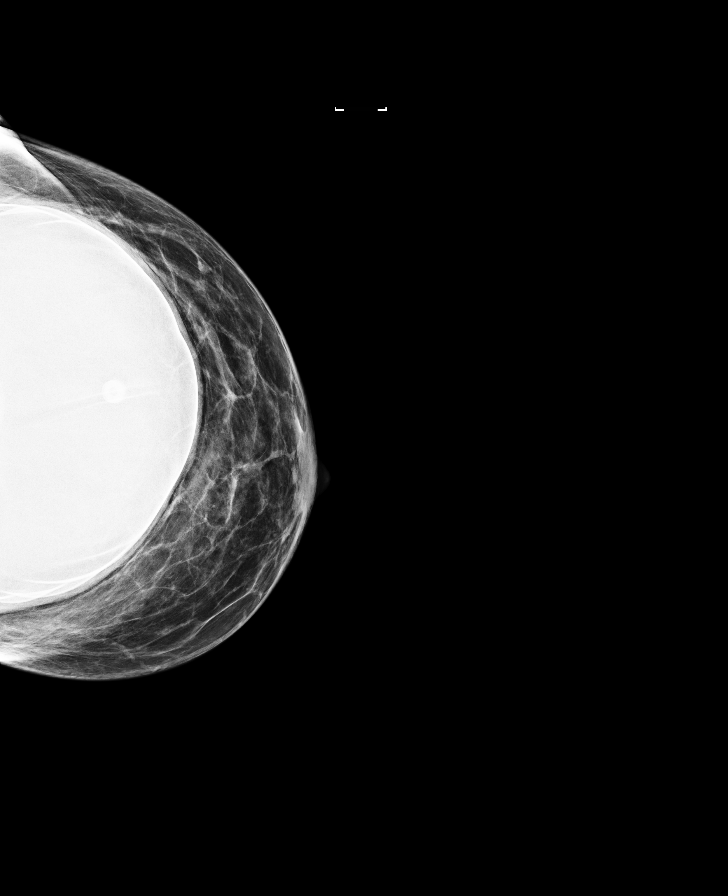

[R CC]
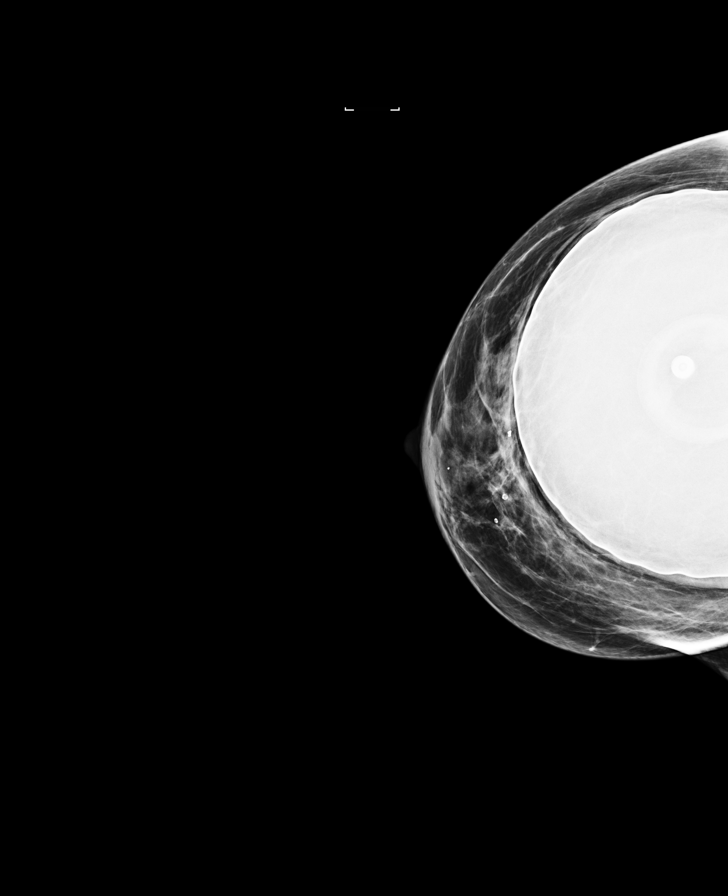

[R MLO]
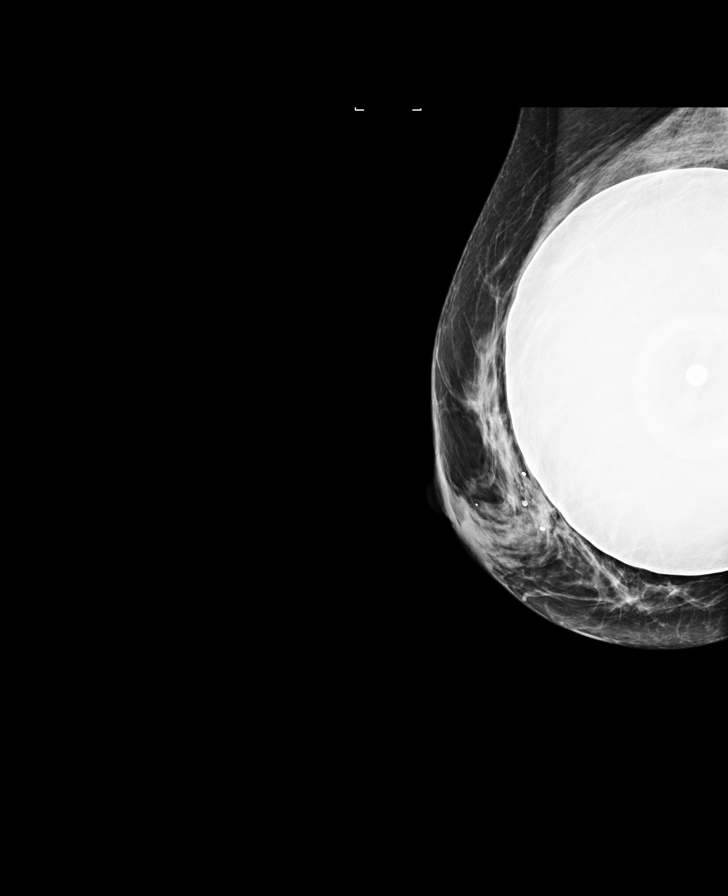

[L MLO synth-2D]
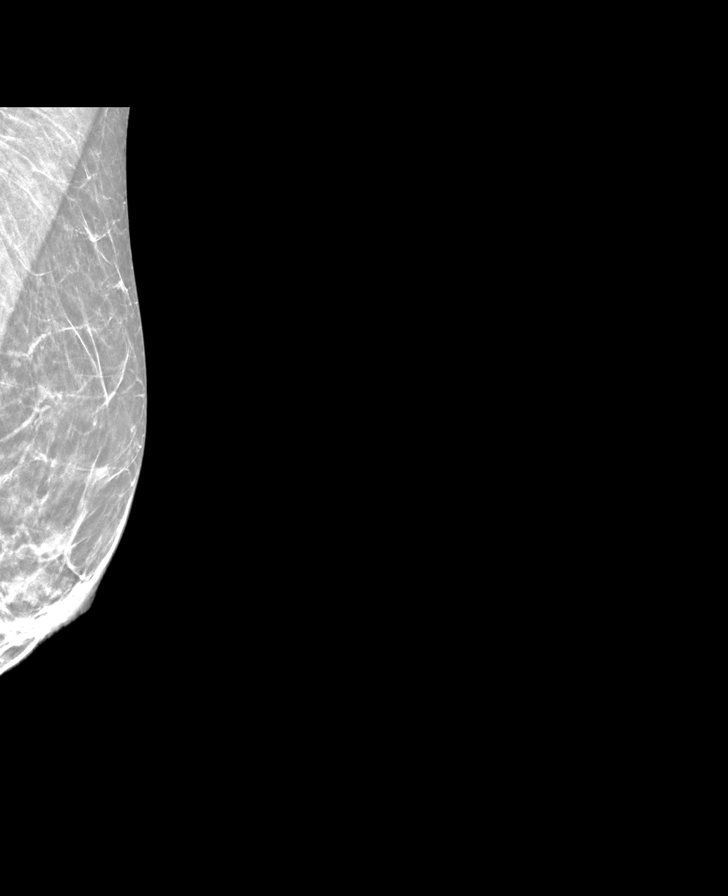

[L CC synth-2D]
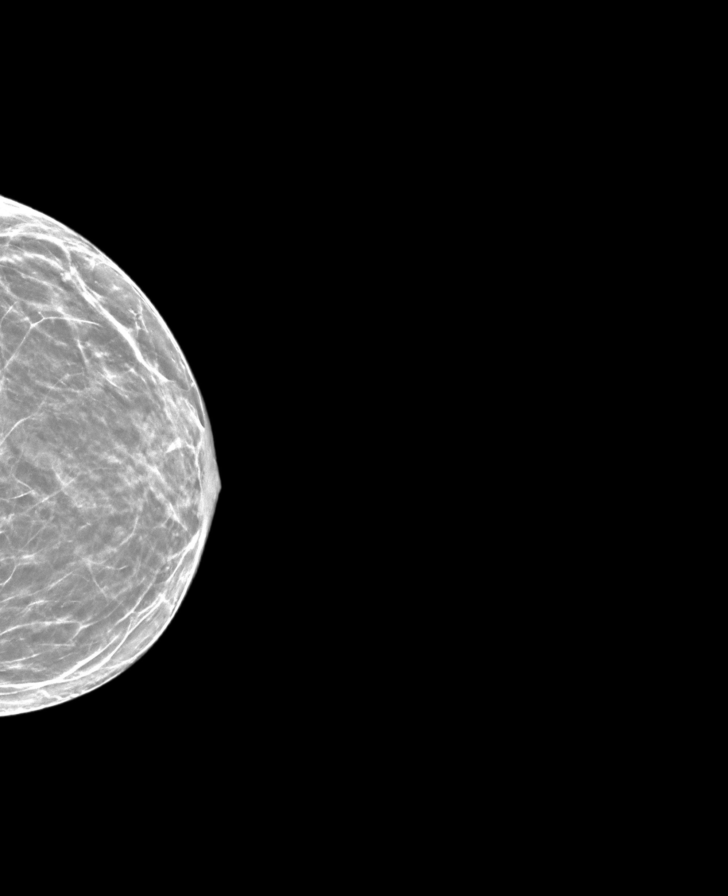

[R MLO synth-2D]
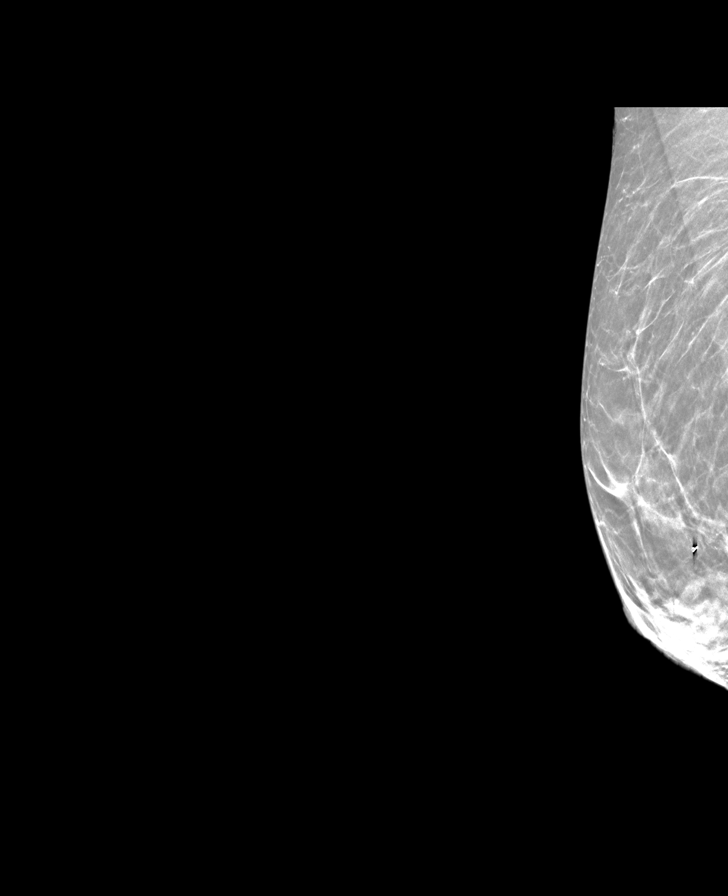

[R CC synth-2D]
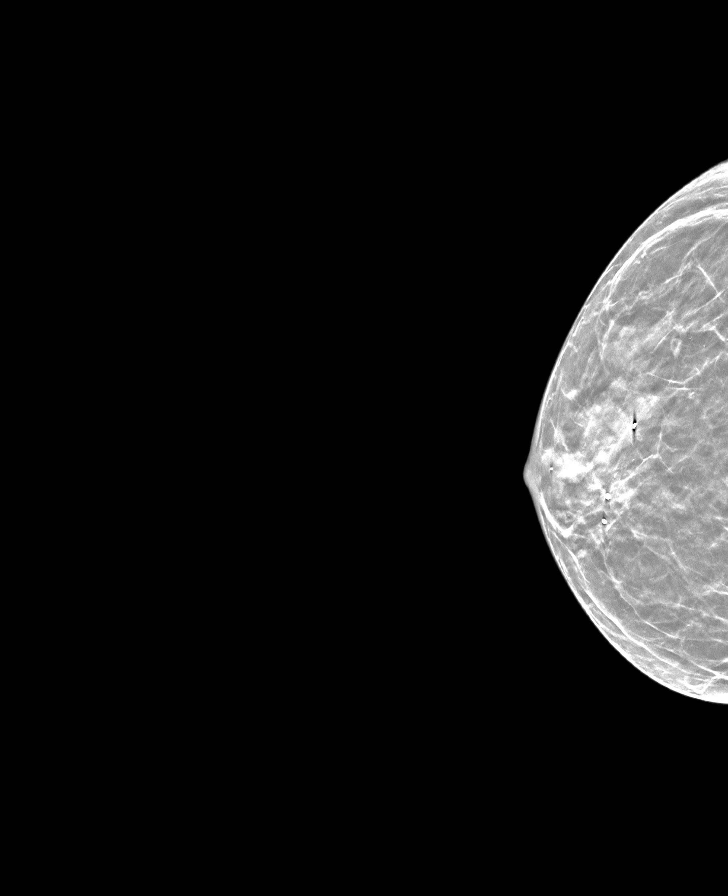

[8 of 28 positions shown; findings below may reference images not displayed]

ACR Breast Density Category c: The breast tissue is heterogeneously
dense, which may obscure small masses.
FINDINGS: The patient has retropectoral saline implants. There are no findings
suspicious for malignancy.
IMPRESSION: No mammographic evidence of malignancy. A result letter of this
screening mammogram will be mailed directly to the patient.

RECOMMENDATION:
Screening mammogram in one year. (Code:FQ-5-EOL)

BI-RADS CATEGORY  1:  Negative.

## 2022-11-27 ENCOUNTER — Other Ambulatory Visit: Payer: Self-pay

## 2022-11-28 ENCOUNTER — Other Ambulatory Visit: Payer: Self-pay

## 2022-11-28 MED ORDER — GABAPENTIN 300 MG PO CAPS
600.0000 mg | ORAL_CAPSULE | Freq: Two times a day (BID) | ORAL | 11 refills | Status: DC
  Filled 2022-11-28: qty 120, 30d supply, fill #0
  Filled 2023-01-15: qty 120, 30d supply, fill #1
  Filled 2023-02-09: qty 120, 30d supply, fill #2
  Filled 2023-03-20: qty 120, 30d supply, fill #3
  Filled 2023-04-16: qty 120, 30d supply, fill #4
  Filled 2023-05-28: qty 120, 30d supply, fill #5
  Filled 2023-06-25: qty 120, 30d supply, fill #6
  Filled 2023-07-24: qty 120, 30d supply, fill #7
  Filled 2023-09-03: qty 120, 30d supply, fill #8
  Filled 2023-10-15: qty 120, 30d supply, fill #9
  Filled 2023-11-10: qty 120, 30d supply, fill #10

## 2023-01-02 ENCOUNTER — Other Ambulatory Visit: Payer: Self-pay

## 2023-01-15 ENCOUNTER — Other Ambulatory Visit: Payer: Self-pay

## 2023-01-25 ENCOUNTER — Other Ambulatory Visit: Payer: Self-pay

## 2023-01-25 ENCOUNTER — Other Ambulatory Visit: Payer: Self-pay | Admitting: Internal Medicine

## 2023-01-25 DIAGNOSIS — Z1231 Encounter for screening mammogram for malignant neoplasm of breast: Secondary | ICD-10-CM

## 2023-01-25 MED ORDER — BUSPIRONE HCL 10 MG PO TABS
10.0000 mg | ORAL_TABLET | Freq: Two times a day (BID) | ORAL | 5 refills | Status: DC
  Filled 2023-01-25: qty 60, 30d supply, fill #0
  Filled 2023-03-05: qty 60, 30d supply, fill #1
  Filled 2023-04-16: qty 60, 30d supply, fill #2
  Filled 2023-05-28: qty 60, 30d supply, fill #3
  Filled 2023-06-25: qty 60, 30d supply, fill #4

## 2023-01-30 ENCOUNTER — Other Ambulatory Visit: Payer: Self-pay

## 2023-01-30 ENCOUNTER — Other Ambulatory Visit: Payer: Self-pay | Admitting: Internal Medicine

## 2023-01-30 DIAGNOSIS — Z0000 Encounter for general adult medical examination without abnormal findings: Secondary | ICD-10-CM

## 2023-01-30 DIAGNOSIS — E782 Mixed hyperlipidemia: Secondary | ICD-10-CM

## 2023-01-30 DIAGNOSIS — Z8249 Family history of ischemic heart disease and other diseases of the circulatory system: Secondary | ICD-10-CM

## 2023-01-30 MED ORDER — VENLAFAXINE HCL ER 150 MG PO CP24
150.0000 mg | ORAL_CAPSULE | Freq: Every day | ORAL | 3 refills | Status: AC
  Filled 2023-01-30 – 2023-03-05 (×3): qty 90, 90d supply, fill #0

## 2023-01-30 MED ORDER — TRAZODONE HCL 50 MG PO TABS
50.0000 mg | ORAL_TABLET | Freq: Every day | ORAL | 3 refills | Status: AC
Start: 2023-01-25 — End: ?
  Filled 2023-01-30 – 2023-03-05 (×3): qty 90, 90d supply, fill #0

## 2023-02-05 ENCOUNTER — Other Ambulatory Visit: Payer: Self-pay

## 2023-02-05 MED ORDER — MELOXICAM 15 MG PO TABS
15.0000 mg | ORAL_TABLET | Freq: Every day | ORAL | 1 refills | Status: DC
  Filled 2023-02-05: qty 30, 30d supply, fill #0
  Filled 2023-03-05: qty 30, 30d supply, fill #1

## 2023-02-07 ENCOUNTER — Ambulatory Visit
Admission: RE | Admit: 2023-02-07 | Discharge: 2023-02-07 | Disposition: A | Discharge status: Home / Self Care | Payer: Managed Care, Other (non HMO) | Source: Ambulatory Visit | Attending: Internal Medicine | Admitting: Internal Medicine

## 2023-02-07 DIAGNOSIS — E782 Mixed hyperlipidemia: Secondary | ICD-10-CM | POA: Insufficient documentation

## 2023-02-07 DIAGNOSIS — Z8249 Family history of ischemic heart disease and other diseases of the circulatory system: Secondary | ICD-10-CM | POA: Insufficient documentation

## 2023-02-07 DIAGNOSIS — Z0000 Encounter for general adult medical examination without abnormal findings: Secondary | ICD-10-CM | POA: Insufficient documentation

## 2023-02-09 ENCOUNTER — Other Ambulatory Visit: Payer: Self-pay

## 2023-02-12 ENCOUNTER — Ambulatory Visit: Payer: Managed Care, Other (non HMO) | Attending: Orthopedic Surgery | Admitting: Occupational Therapy

## 2023-02-12 ENCOUNTER — Encounter: Payer: Self-pay | Admitting: Occupational Therapy

## 2023-02-12 DIAGNOSIS — M25631 Stiffness of right wrist, not elsewhere classified: Secondary | ICD-10-CM | POA: Insufficient documentation

## 2023-02-12 DIAGNOSIS — M25531 Pain in right wrist: Secondary | ICD-10-CM | POA: Diagnosis present

## 2023-02-12 DIAGNOSIS — M25641 Stiffness of right hand, not elsewhere classified: Secondary | ICD-10-CM | POA: Insufficient documentation

## 2023-02-12 DIAGNOSIS — M79641 Pain in right hand: Secondary | ICD-10-CM | POA: Insufficient documentation

## 2023-02-12 DIAGNOSIS — Z9889 Other specified postprocedural states: Secondary | ICD-10-CM | POA: Diagnosis present

## 2023-02-12 NOTE — Therapy (Signed)
OUTPATIENT OCCUPATIONAL THERAPY ORTHO EVALUATION  Patient Name: Ann Jimenez MRN: [id number] DOB:28-Mar-1960, 62 y.o., female Today's Date: 02/12/2023  PCP: Dr Judithann Sheen REFERRING PROVIDER: Dr Rosita Kea  END OF SESSION:  OT End of Session - 02/12/23 1910     Visit Number 1    Number of Visits 8    Date for OT Re-Evaluation 03/26/23    OT Start Time 0820    OT Stop Time 0900    OT Time Calculation (min) 40 min    Activity Tolerance Patient tolerated treatment well    Behavior During Therapy [facility] for tasks assessed/performed             Past Medical History:  Diagnosis Date   Anxiety    Depression    GERD (gastroesophageal reflux disease)    History of 2019 novel coronavirus disease (COVID-19) 10/12/2020   HLD (hyperlipidemia)    Migraines    Overuse of medication    a.) documented episodes of excessive Rx medication use (outside of prescribed parameters), with the main agent being zolpidem.   Suicidal ideations 08/31/2018   a.) SI with (+) attempt by intentional drug OD (APAP x ? + etodolac x 15 + zolpidem x 10 + venlafaxine x 3 + valacyclovir x 6) following verbal altercation with son. Admitted to inpatient psychiatriac unit 09/03/2018 - 09/05/2018.   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  And then by 2:00 my hand and wrist really hurts.  Mostly on the computer and typing.  I do not know if my set up is correct.  I went back 2 weeks after surgery. Pt accompanied by: self  PERTINENT HISTORY:  02/05/23 Ortho note with DR Rosita Kea -The patient is a 62 year old female who underwent right carpal tunnel release on 08/21/2022 and continues to experience significant pain. This is her first visit in about 4 months.  She reports that her pain is worse than it was preoperatively, with pain across the palm at the distal part of her carpal tunnel, particularly when typing. Her fingers stiffen if she remains immobile for 5 minutes. She also experiences stiffness and numbness in her fingers, which are particularly severe in the mornings.   Despite being off work for the past week, she engaged in regular exercise and painting, which did not exacerbate her symptoms. The cold weather exacerbates her symptoms. She has not tried hand therapy.   [name], MD prescribed gabapentin, and she was referred to [name], MD for carpal tunnel syndrome. The dosage of gabapentin was increased to 300 mg. She was previously on Celebrex, but it did not provide relief. She has been using a stress ball for relief. She has no history of kidney disease.  Refer to OT  PRECAUTIONS: None  WEIGHT BEARING RESTRICTIONS: No  PAIN:  Are you having pain? Pain increase to 9/10 during day in hand and wrist   FALLS: Has patient fallen in last 6 months? No  LIVING ENVIRONMENT: Lives with: lives alone  PLOF: Patient helps her dad and her off days renovating a house.  In the spring and summer works in the yard.  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  So I can get my motion and strength back so I can work in the spring and the summer  in the yard did not help my dad with a renovations.  NEXT MD VISIT: ?  OBJECTIVE:  Note: Objective measures were completed at Evaluation unless otherwise noted.  HAND DOMINANCE: Right    UPPER EXTREMITY ROM:     Active ROM Right eval Left eval  Shoulder flexion    Shoulder abduction    Shoulder adduction    Shoulder extension    Shoulder internal rotation    Shoulder external rotation    Elbow flexion    Elbow extension    Wrist flexion 80 90  Wrist extension 54 68  Wrist ulnar deviation 30   Wrist radial deviation 20   Wrist pronation    Wrist supination    (Blank rows = not tested)  Active ROM Right eval Left eval  Thumb MCP (0-60) 60   Thumb IP (0-80) 70   Thumb Radial abd/add (0-55) 52 pull 60   Thumb Palmar abd/add (0-45) 48 pull  60   Thumb Opposition to Small Finger Pull at thumb to base of 5th     Index MCP (0-90)     Index PIP (0-100)     Index DIP (0-70)      Long MCP (0-90)      Long PIP (0-100)      Long DIP (0-70)      Ring MCP (0-90)      Ring PIP (0-100)      Ring DIP (0-70)      Little MCP (0-90)      Little PIP (0-100)      Little DIP (0-70)      (Blank rows = not tested)   UPPER EXTREMITY MMT:    HAND FUNCTION: Grip strength: Right: 54 lbs; Left: 67 lbs, Lateral pinch: Right: 10 lbs, Left: 12 lbs, and 3 point pinch: Right: 9 lbs, Left: 14 lbs pain with grip and 3 point pinch  COORDINATION: WFL  SENSATION: No sensory issues  EDEMA: Over the carpal tunnel  COGNITION: Overall cognitive status: Within functional limits for tasks assessed        TODAY'S TREATMENT:                                                                                                                              DATE: 02/12/23  Reviewed with  patient home exercises Fluidotherapy done to decrease stiffness and pain in right hand prior to review of home exercises. Patient to do contrast 2 times a day morning and at the end of the day. Reviewed with  patient computer use and ergonomics.  Provided patient with a Cica -Care scar pad to wear while using the mouse for cushioning over the scar.  After contrast patient to do metacarpal sprites if dad can help. As well as soft tissue massage in the webspace. Patient to do gentle wrist flexion and extension AAROM stretches Over the armrest for flexion and light prayer stretch.  10 reps hold 3 seconds no pressure through palms. Palmar radial abduction active range of motion with palms together Tendon glides gentle. Followed by opposition to all digits and base of fifth.  Reinforced with patient to work on motion no strength.  No squeezing a ball. To enlarge her grips or handles as well as using larger joints and palms to pick up and hold things.   PATIENT EDUCATION: Education details: findings of eval and HEP  Person educated: Patient Education method: Explanation, Demonstration, Tactile cues, Verbal cues, and Handouts Education comprehension: verbalized understanding, returned demonstration, verbal cues required, and needs further education    GOALS: Goals reviewed with patient? Yes   LONG TERM GOALS: Target date: 6 wks  Patient to be independent in home program to decreasing pain to less than 3/10 during the day.  And decrease stiffness in the morning. Baseline: Stiffness in the morning and digits and wrist.  Pain can increase to 9/10 with use. Goal status: INITIAL  2.  Patient to verbalize 3 modifications and joint protection to decrease stiffness and pain in right hand and wrist. Baseline: No knowledge of modifications.  Patient being squeezing a ball.  Gripping things tight. Goal status: INITIAL  3.  Right wrist active range of motion increased to within normal limits symptom-free for patient to push door open, turn doorknob number for bathing and dressing Baseline: Wrist extension 54 degrees and flexion 80.  Tenderness and increased pain with use as well as on the computer on the  mouse Goal status: INITIAL  4.  Right thumb active range of motion increased to within normal limits symptom-free for patient to turn a doorknob, write and use mouse or computer Baseline: Pain increases to a 9/10 during the day at work.  Patient with increased stiffness and decreased motion and thumb palmar radial abduction as well as opposition. Goal status: INITIAL  5.  Patient's right hand grip and prehension strength to be within normal limits for her age symptom-free to carry groceries, do painting, laundry making the bed and cook. Baseline: Grip on the right 54 and left 67.  Lateral pinch right 10 pounds left 12 pounds and 3-point pinch right 9 pounds and left 14 pounds with some discomfort and pain in the right hand Goal status: INITIAL  ASSESSMENT:  CLINICAL IMPRESSION: Patient seen today for occupational therapy evaluation for right carpal tunnel release.  Surgery was done 08/21/2022.  Patient reports that since surgery patient has increased stiffness in the morning as well as increased pain and get worse during the day.  Patient work on Animator most of the day.  Pain can increase to 9/10 especially over the volar palm to wrist.  Pain can increase to 9/10.  Patient denies any sensory issues.  Patient with decreased range of motion for wrist flexion extension as well as thumb palmar radial abduction.  With decreased strength and increased pain.  Reviewed with patient ergonomics with computer use-patient can benefit from skilled OT services to decrease scar tissue and pain increase motion and strength to return to prior level of function.  Reviewed also with patient modifications and joint protection.  PERFORMANCE DEFICITS: in functional skills including ADLs, IADLs, ROM, strength, pain, flexibility, decreased knowledge of use of DME, and UE functional use,   and psychosocial skills including environmental adaptation and routines and behaviors.   IMPAIRMENTS: are limiting patient from ADLs,  IADLs, rest and sleep, play, leisure, and social participation.   COMORBIDITIES: has no other co-morbidities that affects occupational performance. Patient will benefit from skilled OT to address above impairments and improve overall function.  MODIFICATION OR ASSISTANCE TO COMPLETE EVALUATION: No modification of tasks or assist necessary to complete an evaluation.  OT OCCUPATIONAL PROFILE AND HISTORY: Problem focused assessment: Including review of records relating to presenting problem.  CLINICAL DECISION MAKING: LOW - limited treatment options, no task modification necessary  REHAB POTENTIAL: Good for goals  EVALUATION COMPLEXITY: Low    PLAN:  OT FREQUENCY: 1-2x/week  OT DURATION: 6 weeks  PLANNED INTERVENTIONS: 97535 self care/ADL training, 16109 therapeutic exercise, 97140 manual therapy, 97035 ultrasound, 97018 paraffin, 60454 fluidotherapy, 97034 contrast bath, 97760 Orthotics management and training, scar mobilization, passive range of motion, patient/family education, and DME and/or AE instructions    CONSULTED AND AGREED WITH PLAN OF CARE: Patient     Oletta Cohn, OTR/L,CLT 02/12/2023, 7:12 PM

## 2023-02-16 ENCOUNTER — Ambulatory Visit: Payer: Managed Care, Other (non HMO) | Admitting: Occupational Therapy

## 2023-02-16 DIAGNOSIS — M25531 Pain in right wrist: Secondary | ICD-10-CM

## 2023-02-16 DIAGNOSIS — Z9889 Other specified postprocedural states: Secondary | ICD-10-CM

## 2023-02-16 DIAGNOSIS — M25641 Stiffness of right hand, not elsewhere classified: Secondary | ICD-10-CM

## 2023-02-16 DIAGNOSIS — M79641 Pain in right hand: Secondary | ICD-10-CM

## 2023-02-16 DIAGNOSIS — M25631 Stiffness of right wrist, not elsewhere classified: Secondary | ICD-10-CM

## 2023-02-17 ENCOUNTER — Encounter: Payer: Self-pay | Admitting: Occupational Therapy

## 2023-02-17 NOTE — Therapy (Signed)
OUTPATIENT OCCUPATIONAL THERAPY ORTHO TREATMENT NOTE  Patient Name: Ann Jimenez MRN: [id number] DOB:1960-10-04, 62 y.o., female   PCP: Dr Judithann Sheen REFERRING PROVIDER: Dr Rosita Kea  END OF SESSION:  OT End of Session - 02/17/23 1804     Visit Number 2    Number of Visits 8    Date for OT Re-Evaluation 03/26/23    OT Start Time 0815    OT Stop Time 0900    OT Time Calculation (min) 45 min    Activity Tolerance Patient tolerated treatment well    Behavior During Therapy [facility] for tasks assessed/performed             Past Medical History:  Diagnosis Date   Anxiety    Depression    GERD (gastroesophageal reflux disease)    History of 2019 novel coronavirus disease (COVID-19) 10/12/2020   HLD (hyperlipidemia)    Migraines    Overuse of medication    a.) documented episodes of excessive Rx medication use (outside of prescribed parameters), with the main agent being zolpidem.   Suicidal ideations 08/31/2018   a.) SI with (+) attempt by intentional drug OD (APAP x ? + etodolac x 15 + zolpidem x 10 + venlafaxine x 3 + valacyclovir x 6) following verbal altercation with son. Admitted to inpatient psychiatriac unit 09/03/2018 - 09/05/2018.   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  Has been paying attention to her workspace and set up.   Pt accompanied by: self  PERTINENT HISTORY:  02/05/23 Ortho note with DR Rosita Kea -The patient is a 62 year old female who underwent right carpal tunnel release on 08/21/2022 and continues to experience significant pain. This is her first visit in about 4 months.  She reports that her pain is worse than it was preoperatively, with pain across the palm at the distal part of her carpal tunnel, particularly when typing. Her fingers stiffen if she remains immobile for 5 minutes. She also experiences stiffness and numbness in her fingers, which are particularly severe in the mornings.   Despite being off work for the past week, she engaged in regular exercise and painting, which did not exacerbate her symptoms. The cold weather exacerbates her symptoms. She has not tried hand therapy.   [name], MD prescribed gabapentin, and she was referred to [name], MD for carpal tunnel syndrome. The dosage of gabapentin was increased to 300 mg. She was previously on Celebrex, but it did not provide relief. She has been using a stress ball for relief. She has no history of kidney disease.  Refer to OT  PRECAUTIONS: None     WEIGHT BEARING RESTRICTIONS: No  PAIN:  Are you having pain? Pain increase  to 9/10 during day in hand and wrist   FALLS: Has patient fallen in last 6 months? No  LIVING ENVIRONMENT: Lives with: lives alone  PLOF: Patient helps her dad and her off days renovating a house.  In the spring and summer works in the yard.  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  So I can get my motion and strength back so I can work in the spring and the summer in the yard did not help my dad with a renovations.  NEXT MD VISIT:  ?  OBJECTIVE:  Note: Objective measures were completed at Evaluation unless otherwise noted.  HAND DOMINANCE: Right    UPPER EXTREMITY ROM:     Active ROM Right eval Left eval  Shoulder flexion    Shoulder abduction    Shoulder adduction    Shoulder extension    Shoulder internal rotation    Shoulder external rotation    Elbow flexion    Elbow extension    Wrist flexion 80 90  Wrist extension 54 68  Wrist ulnar deviation 30   Wrist radial deviation 20   Wrist pronation    Wrist supination    (Blank rows = not tested)  Active ROM Right eval Left eval  Thumb MCP (0-60) 60   Thumb IP (0-80) 70   Thumb Radial abd/add (0-55) 52 pull 60   Thumb Palmar abd/add (0-45) 48 pull  60   Thumb Opposition to Small Finger Pull at thumb to base of 5th     Index MCP (0-90)     Index PIP (0-100)     Index DIP (0-70)      Long MCP (0-90)      Long PIP (0-100)      Long DIP (0-70)      Ring MCP (0-90)      Ring PIP (0-100)      Ring DIP (0-70)      Little MCP (0-90)      Little PIP (0-100)      Little DIP (0-70)      (Blank rows = not tested)   UPPER EXTREMITY MMT:    HAND FUNCTION: Grip strength: Right: 54 lbs; Left: 67 lbs, Lateral pinch: Right: 10 lbs, Left: 12 lbs, and 3 point pinch: Right: 9 lbs, Left: 14 lbs pain with grip and 3 point pinch  COORDINATION: WFL  SENSATION: No sensory issues  EDEMA: Over the carpal tunnel  COGNITION: Overall cognitive status: Within functional limits for tasks assessed  TODAY'S TREATMENT:                                                                                                                              DATE: 02/16/23   Fluidotherapy:   Fluidotherapy performed this date for 10 mins to decrease stiffness, pain, increase tissue mobility and range of  motion.   Manual Therapy:  Following fluidotherapy, patient was seen for manual therapy skills including soft tissue spreads to right hand, carpals and metacarpals, soft  tissue massage in webspace.  Scar massage techniques to decrease adhesions and promote improved ROM.  Therapeutic Exercises: Gentle wrist flexion and extension AAROM stretches Over the armrest for flexion and light prayer stretch.  10 reps hold 3 seconds no pressure through palms. Palmar radial abduction active range of motion with palms together Tendon glides with therapist demonstration and cues.  Opposition to all digits and base of fifth.  Reinforced with patient to continue to work on motion no strength yet.  She reports she is no longer squeezing a ball. Continue to look for opportunities to enlarge her grips or handles as well as using larger joints and palms to pick up and hold things. Patient to perform contrast 2 times a day morning and at the end of the day. Discussed modifications to computer use, tool use and ergonomics.   Continue with Cica -Care scar pad to wear while using the mouse for cushioning over the scar. Added cica care for nighttime to scar to hydrate scar and promoted additional healing.   PATIENT EDUCATION: Education details: findings of eval and HEP  Person educated: Patient Education method: Explanation, Demonstration, Tactile cues, Verbal cues, and Handouts Education comprehension: verbalized understanding, returned demonstration, verbal cues required, and needs further education    GOALS: Goals reviewed with patient? Yes   LONG TERM GOALS: Target date: 6 wks  Patient to be independent in home program to decreasing pain to less than 3/10 during the day.  And decrease stiffness in the morning. Baseline: Stiffness in the morning and digits and wrist.  Pain can increase to 9/10 with use. Goal status: INITIAL  2.  Patient to verbalize 3 modifications and joint protection to decrease stiffness and pain in right hand and wrist. Baseline: No knowledge of modifications.  Patient being squeezing a ball.  Gripping things tight. Goal status: INITIAL  3.  Right  wrist active range of motion increased to within normal limits symptom-free for patient to push door open, turn doorknob number for bathing and dressing Baseline: Wrist extension 54 degrees and flexion 80.  Tenderness and increased pain with use as well as on the computer on the mouse Goal status: INITIAL  4.  Right thumb active range of motion increased to within normal limits symptom-free for patient to turn a doorknob, write and use mouse or computer Baseline: Pain increases to a 9/10 during the day at work.  Patient with increased stiffness and decreased motion and thumb palmar radial abduction as well as opposition. Goal status: INITIAL  5.  Patient's right hand grip and prehension strength to be within normal limits for her age symptom-free to carry groceries, do painting, laundry making the bed and cook. Baseline: Grip on the right 54 and left 67.  Lateral pinch right 10 pounds left 12 pounds and 3-point pinch right 9 pounds and left 14 pounds with some discomfort and pain in the right hand Goal status: INITIAL  ASSESSMENT:  CLINICAL IMPRESSION: Patient seen for occupational therapy evaluation for right carpal tunnel release.  Surgery was performed on 08/21/2022.  Patient reports that since surgery patient has increased stiffness in the morning as well as increased pain and get worse during the day.  Patient works on [employer] most of the day.  Pain can increase to 9/10 especially over the volar palm to wrist. Patient denies any sensory issues.  Patient with decreased range of motion for wrist flexion extension as well as thumb palmar radial abduction.  Pt also demonstrates decreased strength, pain improving.   Reviewed with patient ergonomics with computer use. Pt implemented modifications and strategies to her workspace and reporting decreased pain overall this date and demonstrates improved active range of motion in wrist, digits and hand. Pt performing contrast on average one time a day when  working and twice a day when off.  Continue to perform home program, added scar pad for nighttime to hydrate scar and promote additional healing.  Patient continues to benefit from skilled OT services to decrease scar tissue and pain increase motion and strength to return to prior level of function to perform necessary daily activities at home, work and in the community.   PERFORMANCE DEFICITS: in functional skills including ADLs, IADLs, ROM, strength, pain, flexibility, decreased knowledge of use of DME, and UE functional use,   and psychosocial skills including environmental adaptation and routines and behaviors.   IMPAIRMENTS: are limiting patient from ADLs, IADLs, rest and sleep, play, leisure, and social participation.   COMORBIDITIES: has no other co-morbidities that affects occupational performance. Patient will benefit from skilled OT to address above impairments and improve overall function.  MODIFICATION OR ASSISTANCE TO COMPLETE EVALUATION: No modification of tasks or assist necessary to complete an evaluation.  OT OCCUPATIONAL PROFILE AND HISTORY: Problem focused assessment: Including review of records relating to presenting problem.  CLINICAL DECISION MAKING: LOW - limited treatment options, no task modification necessary  REHAB POTENTIAL: Good for goals  EVALUATION COMPLEXITY: Low    PLAN:  OT FREQUENCY: 1-2x/week  OT DURATION: 6 weeks  PLANNED INTERVENTIONS: 97535 self care/ADL training, 16109 therapeutic exercise, 97140 manual therapy, 97035 ultrasound, 97018 paraffin, 60454 fluidotherapy, 97034 contrast bath, 97760 Orthotics management and training, scar mobilization, passive range of motion, patient/family education, and DME and/or AE instructions CONSULTED AND AGREED WITH PLAN OF CARE: Patient   Rayleen Wyrick, OTR/L,CLT 02/17/2023, 6:04 PM

## 2023-02-20 ENCOUNTER — Ambulatory Visit: Payer: Managed Care, Other (non HMO) | Admitting: Occupational Therapy

## 2023-02-20 DIAGNOSIS — M25631 Stiffness of right wrist, not elsewhere classified: Secondary | ICD-10-CM

## 2023-02-20 DIAGNOSIS — M25531 Pain in right wrist: Secondary | ICD-10-CM

## 2023-02-20 DIAGNOSIS — Z9889 Other specified postprocedural states: Secondary | ICD-10-CM | POA: Diagnosis not present

## 2023-02-20 DIAGNOSIS — M25641 Stiffness of right hand, not elsewhere classified: Secondary | ICD-10-CM

## 2023-02-20 DIAGNOSIS — M79641 Pain in right hand: Secondary | ICD-10-CM

## 2023-02-21 ENCOUNTER — Encounter: Payer: Self-pay | Admitting: Occupational Therapy

## 2023-02-21 NOTE — Therapy (Signed)
OUTPATIENT OCCUPATIONAL THERAPY ORTHO TREATMENT NOTE  Patient Name: Ann Jimenez MRN: [id number] DOB:1960-10-09, 62 y.o., female   PCP: Dr Judithann Sheen REFERRING PROVIDER: Dr Rosita Kea  END OF SESSION:  OT End of Session - 02/21/23 1921     Visit Number 3    Number of Visits 8    Date for OT Re-Evaluation 03/26/23    OT Start Time 0816    OT Stop Time 0900    OT Time Calculation (min) 44 min    Activity Tolerance Patient tolerated treatment well    Behavior During Therapy [facility] for tasks assessed/performed             Past Medical History:  Diagnosis Date   Anxiety    Depression    GERD (gastroesophageal reflux disease)    History of 2019 novel coronavirus disease (COVID-19) 10/12/2020   HLD (hyperlipidemia)    Migraines    Overuse of medication    a.) documented episodes of excessive Rx medication use (outside of prescribed parameters), with the main agent being zolpidem.   Suicidal ideations 08/31/2018   a.) SI with (+) attempt by intentional drug OD (APAP x ? + etodolac x 15 + zolpidem x 10 + venlafaxine x 3 + valacyclovir x 6) following verbal altercation with son. Admitted to inpatient psychiatriac unit 09/03/2018 - 09/05/2018.   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  No pain, doing well with exercises and home program.  She has been trying to do more of contrast at work, she is treating both hands the same since B hands have symptoms.  Continues to have some difficulty with fine motor coordination skills and precision work.    Pt accompanied by: self  PERTINENT HISTORY:  02/05/23 Ortho note with DR Rosita Kea -The patient is a 62 year old female who underwent right carpal tunnel release on 08/21/2022 and continues to experience significant pain. This is her first visit in about 4 months.  She reports that her pain is worse than it was preoperatively, with pain across the palm at the distal part of her carpal tunnel, particularly when typing. Her fingers stiffen if she remains immobile for 5 minutes. She also experiences stiffness and numbness in her fingers, which are particularly severe in the mornings.   Despite being off work for the past week, she engaged in regular exercise and painting, which did not exacerbate her symptoms. The cold weather exacerbates her symptoms. She has not tried hand therapy.   [name], MD prescribed gabapentin, and she was referred to [name], MD for carpal tunnel syndrome. The dosage of gabapentin was increased to 300 mg. She was previously on Celebrex, but it did not provide relief. She  has been using a stress ball for relief. She has no history of kidney disease.  Refer to OT  PRECAUTIONS: None  WEIGHT BEARING RESTRICTIONS: No  PAIN:  Are you having pain? Pain increase to 9/10 during day in hand and wrist   FALLS: Has patient fallen in last 6 months? No  LIVING ENVIRONMENT: Lives with: lives alone  PLOF: Patient helps her dad and her off days renovating a house.  In the spring and summer works in the yard.  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  So I can get my motion and strength back so I can work in the spring and the summer in the yard did not help my dad with a renovations.  NEXT MD VISIT: ?  OBJECTIVE:  Note: Objective measures were completed at Evaluation unless otherwise noted.  HAND DOMINANCE: Right UPPER EXTREMITY ROM:     Active ROM Right eval Left eval  Shoulder flexion    Shoulder abduction    Shoulder adduction    Shoulder extension    Shoulder internal rotation    Shoulder external rotation    Elbow flexion    Elbow extension    Wrist flexion 80 90  Wrist extension 54 68  Wrist ulnar deviation 30   Wrist radial deviation 20   Wrist pronation    Wrist supination    (Blank rows = not tested)  Active ROM Right eval Left eval  Thumb MCP (0-60) 60   Thumb IP (0-80) 70   Thumb Radial abd/add (0-55) 52 pull 60   Thumb Palmar abd/add (0-45) 48 pull  60   Thumb Opposition to Small Finger Pull at thumb to base of 5th     Index MCP (0-90)     Index PIP (0-100)     Index DIP (0-70)      Long MCP (0-90)      Long PIP (0-100)      Long DIP (0-70)      Ring MCP (0-90)      Ring PIP (0-100)      Ring DIP (0-70)      Little MCP (0-90)      Little PIP (0-100)      Little DIP (0-70)      (Blank rows = not tested)   UPPER EXTREMITY MMT:    HAND FUNCTION: Grip strength: Right: 54 lbs; Left: 67 lbs, Lateral pinch: Right: 10 lbs, Left: 12 lbs, and 3 point pinch: Right: 9 lbs, Left: 14 lbs pain with grip and 3 point pinch 02/20/2023 Grip strength: Right: 63 lbs; Left: 67 lbs, Lateral pinch: Right: 10 lbs, Left: 12 lbs, and 3 point pinch: Right: 11 lbs, Left: 14 lbs.    COORDINATION: WFL  SENSATION: No sensory issues  EDEMA: Over the carpal tunnel  COGNITION: Overall cognitive status: Within functional limits for tasks assessed  TODAY'S TREATMENT:  DATE: 02/19/23   Fluidotherapy:   Fluidotherapy performed this date for 10 mins to decrease stiffness, pain, increase tissue mobility and range of motion.   Manual Therapy:  Following fluidotherapy, patient was seen for manual therapy skills including soft tissue spreads to right hand, carpals and metacarpals, soft tissue massage in webspace.  Scar massage techniques to decrease adhesions and promote improved ROM.  Therapeutic Exercises: Gentle wrist flexion and extension AAROM stretches Over the armrest for flexion and light prayer stretch.  10 reps hold 3 seconds no pressure through palms. Palmar radial abduction active range of motion with palms together Tendon glides with therapist demonstration and cues.  Opposition to all digits and base of fifth. Reassessed grip and pinch on the right, see above for details.   Reinforced with patient to continue to work on motion, will add light putty next time if symptoms continue to decrease/dissipate.  Continue to look for opportunities to enlarge her grips or handles as well as using larger joints and palms to pick up and hold things. Patient to perform contrast 2 times a day morning and at the end of the day. Discussed continued task modifications to computer use, tool use and ergonomics.   Cica -Care scar pad to wear while using the mouse for cushioning over the scar. cica care for nighttime to scar to hydrate scar and promoted additional healing.   PATIENT EDUCATION: Education details: findings of eval and HEP  Person educated: Patient Education method: Explanation, Demonstration, Tactile cues, Verbal cues, and Handouts Education comprehension: verbalized understanding, returned demonstration, verbal cues required, and needs further education    GOALS: Goals reviewed with patient? Yes  LONG TERM GOALS: Target date: 6 wks  Patient to be independent in home program to decreasing pain to less than 3/10 during the day.  And decrease  stiffness in the morning. Baseline: Stiffness in the morning and digits and wrist.  Pain can increase to 9/10 with use. Goal status: INITIAL  2.  Patient to verbalize 3 modifications and joint protection to decrease stiffness and pain in right hand and wrist. Baseline: No knowledge of modifications.  Patient being squeezing a ball.  Gripping things tight. Goal status: INITIAL  3.  Right wrist active range of motion increased to within normal limits symptom-free for patient to push door open, turn doorknob number for bathing and dressing Baseline: Wrist extension 54 degrees and flexion 80.  Tenderness and increased pain with use as well as on the computer on the mouse Goal status: INITIAL  4.  Right thumb active range of motion increased to within normal limits symptom-free for patient to turn a doorknob, write and use mouse or computer Baseline: Pain increases to a 9/10 during the day at work.  Patient with increased stiffness and decreased motion and thumb palmar radial abduction as well as opposition. Goal status: INITIAL  5.  Patient's right hand grip and prehension strength to be within normal limits for her age symptom-free to carry groceries, do painting, laundry making the bed and cook. Baseline: Grip on the right 54 and left 67.  Lateral pinch right 10 pounds left 12 pounds and 3-point pinch right 9 pounds and left 14 pounds with some discomfort and pain in the right hand Goal status: INITIAL  ASSESSMENT:  CLINICAL IMPRESSION: Patient seen for occupational therapy evaluation for right carpal tunnel release.  Surgery was performed on 08/21/2022.  Patient reports that since surgery patient has increased stiffness in the morning as well as increased pain and  get worse during the day.  Patient works on [employer] most of the day.  Pain can increase to 9/10 especially over the volar palm to wrist. Patient denies any sensory issues.  Patient with decreased range of motion for wrist flexion  extension as well as thumb palmar radial abduction.  Pt also demonstrates decreased strength, pain improving.   Reviewed with patient ergonomics with computer use. Pt implemented modifications and strategies to her workspace and reporting decreased pain overall and demonstrates improved active range of motion in wrist, digits and hand. Pt demonstrates improved right hand grip and pinch compared to measurements at eval.  Will potentially add light resistive putty next session if symptoms continue to decrease and pain remains low/none.  Continue to perform home program, scar pad for nighttime to hydrate scar and promote additional healing.  Patient continues to benefit from skilled OT services to decrease scar tissue and pain increase motion and strength to return to prior level of function to perform necessary daily activities at home, work and in the community.   PERFORMANCE DEFICITS: in functional skills including ADLs, IADLs, ROM, strength, pain, flexibility, decreased knowledge of use of DME, and UE functional use,   and psychosocial skills including environmental adaptation and routines and behaviors.   IMPAIRMENTS: are limiting patient from ADLs, IADLs, rest and sleep, play, leisure, and social participation.   COMORBIDITIES: has no other co-morbidities that affects occupational performance. Patient will benefit from skilled OT to address above impairments and improve overall function.  MODIFICATION OR ASSISTANCE TO COMPLETE EVALUATION: No modification of tasks or assist necessary to complete an evaluation.  OT OCCUPATIONAL PROFILE AND HISTORY: Problem focused assessment: Including review of records relating to presenting problem.  CLINICAL DECISION MAKING: LOW - limited treatment options, no task modification necessary  REHAB POTENTIAL: Good for goals  EVALUATION COMPLEXITY: Low    PLAN:  OT FREQUENCY: 1-2x/week  OT DURATION: 6 weeks  PLANNED INTERVENTIONS: 97535 self care/ADL  training, 16109 therapeutic exercise, 97140 manual therapy, 97035 ultrasound, 97018 paraffin, 60454 fluidotherapy, 97034 contrast bath, 97760 Orthotics management and training, scar mobilization, passive range of motion, patient/family education, and DME and/or AE instructions CONSULTED AND AGREED WITH PLAN OF CARE: Patient   Derrek Gu, OTR/L,CLT 02/21/2023, 7:22 PM

## 2023-02-23 ENCOUNTER — Ambulatory Visit: Payer: Managed Care, Other (non HMO) | Admitting: Occupational Therapy

## 2023-02-23 ENCOUNTER — Encounter: Payer: Self-pay | Admitting: Occupational Therapy

## 2023-02-23 DIAGNOSIS — Z9889 Other specified postprocedural states: Secondary | ICD-10-CM | POA: Diagnosis not present

## 2023-02-23 DIAGNOSIS — M25531 Pain in right wrist: Secondary | ICD-10-CM

## 2023-02-23 DIAGNOSIS — M79641 Pain in right hand: Secondary | ICD-10-CM

## 2023-02-23 DIAGNOSIS — M25631 Stiffness of right wrist, not elsewhere classified: Secondary | ICD-10-CM

## 2023-02-23 DIAGNOSIS — M25641 Stiffness of right hand, not elsewhere classified: Secondary | ICD-10-CM

## 2023-02-23 NOTE — Therapy (Unsigned)
OUTPATIENT OCCUPATIONAL THERAPY ORTHO TREATMENT NOTE  Patient Name: Ann Jimenez MRN: [id number] DOB:May 21, 1960, 62 y.o., female   PCP: Dr Judithann Sheen REFERRING PROVIDER: Dr Rosita Kea  END OF SESSION:  OT End of Session - 02/25/23 1833     Visit Number 4    Number of Visits 8    Date for OT Re-Evaluation 03/26/23    OT Start Time 0815    OT Stop Time 0859    OT Time Calculation (min) 44 min    Activity Tolerance Patient tolerated treatment well    Behavior During Therapy [facility] for tasks assessed/performed             Past Medical History:  Diagnosis Date   Anxiety    Depression    GERD (gastroesophageal reflux disease)    History of 2019 novel coronavirus disease (COVID-19) 10/12/2020   HLD (hyperlipidemia)    Migraines    Overuse of medication    a.) documented episodes of excessive Rx medication use (outside of prescribed parameters), with the main agent being zolpidem.   Suicidal ideations 08/31/2018   a.) SI with (+) attempt by intentional drug OD (APAP x ? + etodolac x 15 + zolpidem x 10 + venlafaxine x 3 + valacyclovir x 6) following verbal altercation with son. Admitted to inpatient psychiatriac unit 09/03/2018 - 09/05/2018.   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    Pt accompanied by: self  PERTINENT HISTORY:  02/05/23 Ortho note with DR Rosita Kea -The patient is a 62 year old female who underwent right carpal tunnel release on 08/21/2022 and continues to experience significant pain. This is her first visit in about 4 months.  She reports that her pain is worse than it was preoperatively, with pain across the palm at the distal part of her carpal tunnel, particularly when typing. Her fingers stiffen if she remains immobile for 5 minutes. She also experiences stiffness and numbness in her fingers, which are particularly severe in the mornings.   Despite being off work for the past week, she engaged in regular exercise and painting, which did not exacerbate her symptoms. The cold weather exacerbates her symptoms. She has not tried hand therapy.   [name], MD prescribed gabapentin, and she was referred to [name], MD for carpal tunnel syndrome. The dosage of gabapentin was increased to 300 mg. She was previously on Celebrex, but it did not provide relief. She has been using a stress ball for relief. She has no history of kidney disease.  Refer to OT  PRECAUTIONS: None  WEIGHT BEARING RESTRICTIONS: No  PAIN:  Are you having pain? Pain increase to 9/10 during day in hand and wrist   FALLS: Has patient  fallen in last 6 months? No  LIVING ENVIRONMENT: Lives with: lives alone  PLOF: Patient helps her dad and her off days renovating a house.  In the spring and summer works in the yard.  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  So I can get my motion and strength back so I can work in the spring and the summer in the yard did not help my dad with a renovations.  NEXT MD VISIT: ?  OBJECTIVE:  Note: Objective measures were completed at Evaluation unless  otherwise noted.  HAND DOMINANCE: Right UPPER EXTREMITY ROM:     Active ROM Right eval Left eval  Shoulder flexion    Shoulder abduction    Shoulder adduction    Shoulder extension    Shoulder internal rotation    Shoulder external rotation    Elbow flexion    Elbow extension    Wrist flexion 80 90  Wrist extension 54 68  Wrist ulnar deviation 30   Wrist radial deviation 20   Wrist pronation    Wrist supination    (Blank rows = not tested)  Active ROM Right eval Left eval  Thumb MCP (0-60) 60   Thumb IP (0-80) 70   Thumb Radial abd/add (0-55) 52 pull 60   Thumb Palmar abd/add (0-45) 48 pull  60   Thumb Opposition to Small Finger Pull at thumb to base of 5th     Index MCP (0-90)     Index PIP (0-100)     Index DIP (0-70)      Long MCP (0-90)      Long PIP (0-100)      Long DIP (0-70)      Ring MCP (0-90)      Ring PIP (0-100)      Ring DIP (0-70)      Little MCP (0-90)      Little PIP (0-100)      Little DIP (0-70)      (Blank rows = not tested)   UPPER EXTREMITY MMT:    HAND FUNCTION: Grip strength: Right: 54 lbs; Left: 67 lbs, Lateral pinch: Right: 10 lbs, Left: 12 lbs, and 3 point pinch: Right: 9 lbs, Left: 14 lbs pain with grip and 3 point pinch 02/20/2023 Grip strength: Right: 63 lbs; Left: 67 lbs, Lateral pinch: Right: 10 lbs, Left: 12 lbs, and 3 point pinch: Right: 11 lbs, Left: 14 lbs.    COORDINATION: WFL  SENSATION: No sensory issues  EDEMA: Over the carpal tunnel  COGNITION: Overall cognitive status: Within functional limits for tasks assessed  TODAY'S TREATMENT:                                                                                                                              DATE: 02/23/23   No pain, has some stiffness in the  am but feels like it might be arthritis.  No pain at work now during her work day even later in the day like she had before.   Fluidotherapy:   Fluidotherapy performed this date for 10 mins to decrease  stiffness, pain, increase tissue mobility and range of motion.  Pt performing active ROM to wrist, hand and digits while in fluidotherapy active motion.  Manual Therapy:  Following fluidotherapy, patient was seen for manual therapy skills including soft tissue spreads to right hand, carpals and metacarpals, soft tissue massage in webspace.  Scar massage techniques to decrease adhesions and promote improved ROM. Use of extractor on scar to decrease adhesions.     Therapeutic Exercises: Wrist flexion and extension AAROM stretches Over the armrest for flexion and light prayer stretch.  10 reps hold 3 seconds no pressure through palms. Palmar radial abduction active range of motion with palms together Tendon glides with therapist demonstration and cues.  Opposition to all digits and base of fifth. Added light resistive putty for 1 set of 10 reps, can add another set in 3 days if remains painfree.   Patient to continue to work on motion at home and in the clinic.    Continue to look for opportunities to enlarge her grips or handles as well as using larger joints and palms to pick up and hold things. Patient to perform contrast 2 times a day morning and at the end of the day. Discussed continued task modifications to computer use, tool use and ergonomics.   Cica -Care scar pad to wear while using the mouse for cushioning over the scar. cica care for nighttime to scar to hydrate scar and promoted additional healing.   PATIENT EDUCATION: Education details: findings of eval and HEP  Person educated: Patient Education method: Explanation, Demonstration, Tactile cues, Verbal cues, and Handouts Education comprehension: verbalized understanding, returned demonstration, verbal cues required, and needs further education    GOALS: Goals reviewed with patient? Yes  LONG TERM GOALS: Target date: 6 wks  Patient to be independent in home program to decreasing pain to less than 3/10 during the day.  And  decrease stiffness in the morning. Baseline: Stiffness in the morning and digits and wrist.  Pain can increase to 9/10 with use. Goal status: INITIAL  2.  Patient to verbalize 3 modifications and joint protection to decrease stiffness and pain in right hand and wrist. Baseline: No knowledge of modifications.  Patient being squeezing a ball.  Gripping things tight. Goal status: INITIAL  3.  Right wrist active range of motion increased to within normal limits symptom-free for patient to push door open, turn doorknob number for bathing and dressing Baseline: Wrist extension 54 degrees and flexion 80.  Tenderness and increased pain with use as well as on the computer on the mouse Goal status: INITIAL  4.  Right thumb active range of motion increased to within normal limits symptom-free for patient to turn a doorknob, write and use mouse or computer Baseline: Pain increases to a 9/10 during the day at work.  Patient with increased stiffness and decreased motion and thumb palmar radial abduction as well as opposition. Goal status: INITIAL  5.  Patient's right hand grip and prehension strength to be within normal limits for her age symptom-free to carry groceries, do painting, laundry making the bed and cook. Baseline: Grip on the right 54 and left 67.  Lateral pinch right 10 pounds left 12 pounds and 3-point pinch right 9 pounds and left 14  pounds with some discomfort and pain in the right hand Goal status: INITIAL  ASSESSMENT:  CLINICAL IMPRESSION: Patient seen for occupational therapy evaluation for right carpal tunnel release.  Surgery was performed on 08/21/2022.  Patient reports that since surgery patient has increased stiffness in the morning as well as increased pain and get worse during the day.  Patient works on [employer] most of the day.  Pain can increase to 9/10 especially over the volar palm to wrist. Patient denies any sensory issues.  Patient with decreased range of motion for wrist  flexion extension as well as thumb palmar radial abduction.  Pt also demonstrates decreased strength, pain improving.   Reviewed with patient ergonomics with computer use. Pt implemented modifications and strategies to her workspace and reporting decreased pain overall and demonstrates improved active range of motion in wrist, digits and hand. Pt demonstrates improved right hand grip and pinch compared to measurements at eval.  Addition of light resistive putty this session and will continue to monitor pain responses and adjust exercises as pt tolerates. Great progress overall. Continue to perform home program, scar pad for nighttime to hydrate scar and promote additional healing.  Patient continues to benefit from skilled OT services to decrease scar tissue and pain increase motion and strength to return to prior level of function to perform necessary daily activities at home, work and in the community.   PERFORMANCE DEFICITS: in functional skills including ADLs, IADLs, ROM, strength, pain, flexibility, decreased knowledge of use of DME, and UE functional use,   and psychosocial skills including environmental adaptation and routines and behaviors.   IMPAIRMENTS: are limiting patient from ADLs, IADLs, rest and sleep, play, leisure, and social participation.   COMORBIDITIES: has no other co-morbidities that affects occupational performance. Patient will benefit from skilled OT to address above impairments and improve overall function.  MODIFICATION OR ASSISTANCE TO COMPLETE EVALUATION: No modification of tasks or assist necessary to complete an evaluation.  OT OCCUPATIONAL PROFILE AND HISTORY: Problem focused assessment: Including review of records relating to presenting problem.  CLINICAL DECISION MAKING: LOW - limited treatment options, no task modification necessary  REHAB POTENTIAL: Good for goals  EVALUATION COMPLEXITY: Low   PLAN:  OT FREQUENCY: 1-2x/week  OT DURATION: 6 weeks  PLANNED  INTERVENTIONS: 97535 self care/ADL training, 16109 therapeutic exercise, 97140 manual therapy, 97035 ultrasound, 97018 paraffin, 60454 fluidotherapy, 97034 contrast bath, 97760 Orthotics management and training, scar mobilization, passive range of motion, patient/family education, and DME and/or AE instructions CONSULTED AND AGREED WITH PLAN OF CARE: Patient   Derrek Gu, OTR/L,CLT 02/25/2023, 6:33 PM

## 2023-02-26 ENCOUNTER — Ambulatory Visit: Payer: Managed Care, Other (non HMO) | Admitting: Occupational Therapy

## 2023-02-26 ENCOUNTER — Encounter: Payer: Self-pay | Admitting: Occupational Therapy

## 2023-02-26 DIAGNOSIS — Z9889 Other specified postprocedural states: Secondary | ICD-10-CM | POA: Diagnosis not present

## 2023-02-26 DIAGNOSIS — M25631 Stiffness of right wrist, not elsewhere classified: Secondary | ICD-10-CM

## 2023-02-26 DIAGNOSIS — M79641 Pain in right hand: Secondary | ICD-10-CM

## 2023-02-26 DIAGNOSIS — M25531 Pain in right wrist: Secondary | ICD-10-CM

## 2023-02-26 DIAGNOSIS — M25641 Stiffness of right hand, not elsewhere classified: Secondary | ICD-10-CM

## 2023-02-26 NOTE — Therapy (Signed)
OUTPATIENT OCCUPATIONAL THERAPY ORTHO TREATMENT NOTE  Patient Name: Ann Jimenez MRN: [id number] DOB:1960-07-11, 62 y.o., female   PCP: Dr Ann Jimenez REFERRING PROVIDER: Dr Ann Jimenez  END OF SESSION:  OT End of Session - 02/26/23 0905     Visit Number 5    Number of Visits 8    Date for OT Re-Evaluation 03/26/23    OT Start Time 0901    OT Stop Time 0945    OT Time Calculation (min) 44 min    Activity Tolerance Patient tolerated treatment well    Behavior During Therapy [facility] for tasks assessed/performed             Past Medical History:  Diagnosis Date   Anxiety    Depression    GERD (gastroesophageal reflux disease)    History of 2019 novel coronavirus disease (COVID-19) 10/12/2020   HLD (hyperlipidemia)    Migraines    Overuse of medication    a.) documented episodes of excessive Rx medication use (outside of prescribed parameters), with the main agent being zolpidem.   Suicidal ideations 08/31/2018   a.) SI with (+) attempt by intentional drug OD (APAP x ? + etodolac x 15 + zolpidem x 10 + venlafaxine x 3 + valacyclovir x 6) following verbal altercation with son. Admitted to inpatient psychiatriac unit 09/03/2018 - 09/05/2018.   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  She did well with putty exercises from last session.  Pt accompanied by: self  PERTINENT HISTORY:  02/05/23 Ortho note with DR Ann Jimenez -The patient is a 62 year old female who underwent right carpal tunnel release on 08/21/2022 and continues to experience significant pain. This is her first visit in about 4 months.  She reports that her pain is worse than it was preoperatively, with pain across the palm at the distal part of her carpal tunnel, particularly when typing. Her fingers stiffen if she remains immobile for 5 minutes. She also experiences stiffness and numbness in her fingers, which are particularly severe in the mornings.   Despite being off work for the past week, she engaged in regular exercise and painting, which did not exacerbate her symptoms. The cold weather exacerbates her symptoms. She has not tried hand therapy.   Ann Lorenza Chick, MD prescribed gabapentin, and she was referred to Ann Peru, MD for carpal tunnel syndrome. The dosage of gabapentin was increased to 300 mg. She was previously on Celebrex, but it did not provide relief. She has been using a stress ball for relief. She has no history of kidney disease.  Refer to OT  PRECAUTIONS: None  WEIGHT BEARING RESTRICTIONS: No  PAIN:  Are you having pain? Pain increase to 9/10 during day in hand and wrist  FALLS: Has patient fallen in last 6 months? No  LIVING ENVIRONMENT: Lives with: lives alone  PLOF: Patient helps her dad and her off days renovating a house.  In the spring and summer works in the yard.  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  So I can get my motion and strength back so I can work in the spring and the summer in the yard did not help my dad with a renovations.  NEXT MD VISIT: ?  OBJECTIVE:  Note: Objective measures were  completed at Evaluation unless otherwise noted.  HAND DOMINANCE: Right UPPER EXTREMITY ROM:     Active ROM Right eval Left eval  Shoulder flexion    Shoulder abduction    Shoulder adduction    Shoulder extension    Shoulder internal rotation    Shoulder external rotation    Elbow flexion    Elbow extension    Wrist flexion 80 90  Wrist extension 54 68  Wrist ulnar deviation 30   Wrist radial deviation 20   Wrist pronation    Wrist supination    (Blank rows = not tested)  Active ROM Right eval Left eval  Thumb MCP (0-60) 60   Thumb IP (0-80) 70   Thumb Radial abd/add (0-55) 52 pull 60   Thumb Palmar abd/add (0-45) 48 pull  60   Thumb Opposition to Small Finger Pull at thumb to base of 5th     Index MCP (0-90)     Index PIP (0-100)     Index DIP (0-70)      Long MCP (0-90)      Long PIP (0-100)      Long DIP (0-70)      Ring MCP (0-90)      Ring PIP (0-100)      Ring DIP (0-70)      Little MCP (0-90)      Little PIP (0-100)      Little DIP (0-70)      (Blank rows = not tested)   UPPER EXTREMITY MMT:    HAND FUNCTION: Grip strength: Right: 54 lbs; Left: 67 lbs, Lateral pinch: Right: 10 lbs, Left: 12 lbs, and 3 point pinch: Right: 9 lbs, Left: 14 lbs pain with grip and 3 point pinch 02/20/2023 Grip strength: Right: 63 lbs; Left: 67 lbs, Lateral pinch: Right: 10 lbs, Left: 12 lbs, and 3 point pinch: Right: 11 lbs, Left: 14 lbs.    COORDINATION: WFL  SENSATION: No sensory issues  EDEMA: Over the carpal tunnel  COGNITION: Overall cognitive status: Within functional limits for tasks assessed  TODAY'S TREATMENT:                                                                                                                              DATE: 02/26/23   Pt reports her symptoms  have reduced and only feels some stiffness in the am, no pain.  Has paraffin at home she can try this week in the am to see if this will help with the stiffness.  Paraffin: Paraffin  performed this date for 10 mins to decrease stiffness, pain, increase tissue mobility and range of motion.    Manual Therapy:  Following paraffin, patient was seen for manual therapy skills including soft tissue spreads to right hand, carpals and metacarpals, soft tissue massage in webspace.  Scar massage techniques to decrease adhesions and promote improved ROM. Use of extractor on scar to decrease adhesions.     Therapeutic Exercises: Wrist flexion and extension AAROM stretches Over the armrest for flexion and light prayer stretch.  10 reps hold 3 seconds no pressure through palms. Palmar radial abduction active range of motion with palms together Tendon glides with therapist demonstration and cues.  Opposition to all digits and base of fifth. Light resistive teal putty upgraded to 2 set of 10 reps, can add another set in 3 days if remains pain free.   Patient to continue to work on motion at home and in the clinic.    Continue to look for opportunities to enlarge her grips or handles as well as using larger joints and palms to pick up and hold things. Patient to perform contrast 2 times a day morning and at the end of the day. Discussed continued task modifications to computer use, tool use and ergonomics.   Cica -Care scar pad to wear while using the mouse for cushioning over the scar. cica care for nighttime to scar to hydrate scar and promoted additional healing.   PATIENT EDUCATION: Education details: findings of eval and HEP  Person educated: Patient Education method: Explanation, Demonstration, Tactile cues, Verbal cues, and Handouts Education comprehension: verbalized understanding, returned demonstration, verbal cues required, and needs further education    GOALS: Goals reviewed with patient? Yes  LONG TERM GOALS: Target date: 6 wks  Patient to be independent in home program to decreasing pain to less than 3/10 during the day.  And decrease stiffness in the  morning. Baseline: Stiffness in the morning and digits and wrist.  Pain can increase to 9/10 with use. Goal status: INITIAL  2.  Patient to verbalize 3 modifications and joint protection to decrease stiffness and pain in right hand and wrist. Baseline: No knowledge of modifications.  Patient being squeezing a ball.  Gripping things tight. Goal status: INITIAL  3.  Right wrist active range of motion increased to within normal limits symptom-free for patient to push door open, turn doorknob number for bathing and dressing Baseline: Wrist extension 54 degrees and flexion 80.  Tenderness and increased pain with use as well as on the computer on the mouse Goal status: INITIAL  4.  Right thumb active range of motion increased to within normal limits symptom-free for patient to turn a doorknob, write and use mouse or computer Baseline: Pain increases to a 9/10 during the day at work.  Patient with increased stiffness and decreased motion and thumb palmar radial abduction as well as opposition. Goal status: INITIAL  5.  Patient's right hand grip and prehension strength to be within normal limits for her age symptom-free to carry groceries, do painting, laundry making the bed and cook. Baseline: Grip on the right 54 and left 67.  Lateral pinch right 10 pounds left 12 pounds and 3-point pinch right 9 pounds and left 14 pounds with some discomfort and pain in  the right hand Goal status: INITIAL  ASSESSMENT:  CLINICAL IMPRESSION: Patient seen for occupational therapy evaluation for right carpal tunnel release.  Surgery was performed on 08/21/2022.  Patient reports that since surgery patient has increased stiffness in the morning as well as increased pain and get worse during the day.  Patient works on [employer] most of the day.  Prior to therapy, pain could increase to 9/10 especially over the volar palm to wrist. Patient denies any sensory issues.  Reviewed with patient ergonomics with computer use. Pt  implemented modifications and strategies to her workspace and reporting decreased pain overall and demonstrates improved active range of motion in wrist, digits and hand. Pt demonstrates improved right hand grip and pinch compared to measurements at eval. Pt tolerated light resistive putty well from last session, upgrade to 2 sets this week and up to 3 sets after 3-4 days and no pain.  Will continue to monitor pain responses and adjust exercises as pt tolerates. Great progress overall, pt to continue to perform home program, scar pad for nighttime to hydrate scar and promote additional healing and follow up in a couple weeks. Patient continues to benefit from skilled OT services to decrease scar tissue and pain increase motion and strength to return to prior level of function to perform necessary daily activities at home, work and in the community.   PERFORMANCE DEFICITS: in functional skills including ADLs, IADLs, ROM, strength, pain, flexibility, decreased knowledge of use of DME, and UE functional use,   and psychosocial skills including environmental adaptation and routines and behaviors.   IMPAIRMENTS: are limiting patient from ADLs, IADLs, rest and sleep, play, leisure, and social participation.   COMORBIDITIES: has no other co-morbidities that affects occupational performance. Patient will benefit from skilled OT to address above impairments and improve overall function.  MODIFICATION OR ASSISTANCE TO COMPLETE EVALUATION: No modification of tasks or assist necessary to complete an evaluation.  OT OCCUPATIONAL PROFILE AND HISTORY: Problem focused assessment: Including review of records relating to presenting problem.  CLINICAL DECISION MAKING: LOW - limited treatment options, no task modification necessary  REHAB POTENTIAL: Good for goals  EVALUATION COMPLEXITY: Low   PLAN:  OT FREQUENCY: 1-2x/week  OT DURATION: 6 weeks  PLANNED INTERVENTIONS: 97535 self care/ADL training, 16109  therapeutic exercise, 97140 manual therapy, 97035 ultrasound, 97018 paraffin, 60454 fluidotherapy, 97034 contrast bath, 97760 Orthotics management and training, scar mobilization, passive range of motion, patient/family education, and DME and/or AE instructions CONSULTED AND AGREED WITH PLAN OF CARE: Patient   Hong Moring, OTR/L,CLT 02/26/2023, 11:33 AM

## 2023-03-05 ENCOUNTER — Other Ambulatory Visit: Payer: Self-pay

## 2023-03-13 ENCOUNTER — Ambulatory Visit: Payer: Managed Care, Other (non HMO) | Attending: Orthopedic Surgery | Admitting: Occupational Therapy

## 2023-03-13 DIAGNOSIS — Z9889 Other specified postprocedural states: Secondary | ICD-10-CM | POA: Diagnosis present

## 2023-03-13 DIAGNOSIS — M25531 Pain in right wrist: Secondary | ICD-10-CM | POA: Insufficient documentation

## 2023-03-13 DIAGNOSIS — M25631 Stiffness of right wrist, not elsewhere classified: Secondary | ICD-10-CM | POA: Diagnosis present

## 2023-03-13 DIAGNOSIS — M25641 Stiffness of right hand, not elsewhere classified: Secondary | ICD-10-CM | POA: Insufficient documentation

## 2023-03-13 DIAGNOSIS — M79641 Pain in right hand: Secondary | ICD-10-CM | POA: Insufficient documentation

## 2023-03-13 NOTE — Therapy (Signed)
 OUTPATIENT OCCUPATIONAL THERAPY ORTHO TREATMENT NOTE/Discharge  Patient Name: Ann Jimenez MRN: [id number] DOB:[birth year], 63 y.o., female   PCP: Dr Auston REFERRING PROVIDER: Dr Kathlynn  END OF SESSION:  OT End of Session - 03/13/23 0841     Visit Number 6    Number of Visits 6    Date for OT Re-Evaluation 03/13/23    OT Start Time 0816    OT Stop Time 0840    OT Time Calculation (min) 24 min    Activity Tolerance Patient tolerated treatment well    Behavior During Therapy [facility] for tasks assessed/performed             Past Medical History:  Diagnosis Date   Anxiety    Depression    GERD (gastroesophageal reflux disease)    History of 2019 novel coronavirus disease (COVID-19) 10/12/2020   HLD (hyperlipidemia)    Migraines    Overuse of medication    a.) documented episodes of excessive Rx medication use (outside of prescribed parameters), with the main agent being zolpidem .   Suicidal ideations 08/31/2018   a.) SI with (+) attempt by intentional drug OD (APAP x ? + etodolac  x 15 + zolpidem  x 10 + venlafaxine  x 3 + valacyclovir  x 6) following verbal altercation with son. Admitted to inpatient psychiatriac unit 09/03/2018 - 09/05/2018.   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) 09/03/2018   Major depressive disorder, recurrent severe without psychotic features (HCC) 09/01/2018   Drug overdose 08/31/2018    ONSET DATE: 08/21/22  REFERRING DIAG: R CTR  THERAPY DIAG:  Status post carpal tunnel release  Stiffness of right wrist, not elsewhere classified  Pain in right wrist  Stiffness of right hand, not elsewhere classified  Pain in right hand  Rationale for Evaluation and Treatment: Rehabilitation  SUBJECTIVE STATEMENT:  I am  doing well - so much better- can work without pain - no pain -more strength only stiffness in the morning but my other hand too  Pt accompanied by: self  PERTINENT HISTORY:  02/05/23 Ortho note with DR Kathlynn -The patient is a 63 year old female who underwent right carpal tunnel release on 08/21/2022 and continues to experience significant pain. This is her first visit in about 4 months.  She reports that her pain is worse than it was preoperatively, with pain across the palm at the distal part of her carpal tunnel, particularly when typing. Her fingers stiffen if she remains immobile for 5 minutes. She also experiences stiffness and numbness in her fingers, which are particularly severe in the mornings.   Despite being off work for the past week, she engaged in regular exercise and painting, which did not exacerbate her symptoms. The cold weather exacerbates her symptoms. She has not tried hand therapy.   Mayur Loree Blanch, MD prescribed gabapentin , and she was referred to Jannett Fairly, MD for carpal tunnel syndrome. The dosage of gabapentin  was increased to 300 mg. She was previously on Celebrex, but it did not provide relief. She has been using a stress ball for relief. She has no history of kidney disease.  Refer to OT  PRECAUTIONS: None  WEIGHT BEARING RESTRICTIONS: No  PAIN:  0/10 pain  FALLS: Has patient fallen in last 6 months? No  LIVING  ENVIRONMENT: Lives with: lives alone  PLOF: Patient helps her dad and her off days renovating a house.  In the spring and summer works in the yard.  Working at [employer] and [employer] - 2 10-hour days 9-hour day and 6-hour day  PATIENT GOALS: I want the pain better and the stiffness.  So I can get my motion and strength back so I can work in the spring and the summer in the yard did not help my dad with a renovations.  NEXT MD VISIT: ?  OBJECTIVE:  Note: Objective measures were completed at Evaluation unless otherwise noted.  HAND  DOMINANCE: Right UPPER EXTREMITY ROM:     Active ROM Right eval Left eval R  03/12/22  Shoulder flexion     Shoulder abduction     Shoulder adduction     Shoulder extension     Shoulder internal rotation     Shoulder external rotation     Elbow flexion     Elbow extension     Wrist flexion 80 90 90  Wrist extension 54 68 70  Wrist ulnar deviation 30  30  Wrist radial deviation 20  20  Wrist pronation     Wrist supination     (Blank rows = not tested)  Active ROM Right eval Left eval R  03/12/22  Thumb MCP (0-60) 60    Thumb IP (0-80) 70    Thumb Radial abd/add (0-55) 52 pull 60  60  Thumb Palmar abd/add (0-45) 48 pull  60  60  Thumb Opposition to Small Finger Pull at thumb to base of 5th    Opposition to bae of 5th   Index MCP (0-90)      Index PIP (0-100)      Index DIP (0-70)       Long MCP (0-90)       Long PIP (0-100)       Long DIP (0-70)       Ring MCP (0-90)       Ring PIP (0-100)       Ring DIP (0-70)       Little MCP (0-90)       Little PIP (0-100)       Little DIP (0-70)       (Blank rows = not tested)   UPPER EXTREMITY MMT:    HAND FUNCTION: Grip strength: Right: 54 lbs; Left: 67 lbs, Lateral pinch: Right: 10 lbs, Left: 12 lbs, and 3 point pinch: Right: 9 lbs, Left: 14 lbs pain with grip and 3 point pinch 02/20/2023 Grip strength: Right: 63 lbs; Left: 67 lbs, Lateral pinch: Right: 10 lbs, Left: 12 lbs, and 3 point pinch: Right: 11 lbs, Left: 14 lbs.   03/12/22 : Grip strength: Right: 72 lbs; Left: 67 lbs, Lateral pinch: Right: 14 lbs, Left: 12 lbs, and 3 point pinch: Right: 13 lbs, Left: 14 lbs.    COORDINATION: WFL  SENSATION: No sensory issues  EDEMA: Over the carpal tunnel  COGNITION: Overall cognitive status: Within functional limits for tasks assessed  TODAY'S TREATMENT:  DATE: 02/26/23   Pt reports her symptoms have  reduced - no pain anymore only feels some stiffness in the am, same as other hand-  Has paraffin at home she uses in the am and work well Paraffin: Paraffin performed this date for 8 mins to decrease stiffness and review of HEP prior to discharge today and going to work      Therapeutic Exercises: Wrist flexion and extension WNL  Wrist in all planes 5/5 strength PA and RA strength 5/5  Tendon glides to cont at home after paraffin Opposition to all digits and base of fifth to be done after paraffin And wrist ext stretch  Cont with putty to tolerance symptom free as needed . Light resistive teal putty upgraded to 2 set of 10 reps, can add another set in 3 days if remains pain free.   Patient to continue to work on motion at home  Continue to look for opportunities to enlarge her grips or handles as well as using larger joints and palms to pick up and hold things. Discussed continued task modifications to computer use, tool use and ergonomics.   Also padded glove for vibration tools   PATIENT EDUCATION: Education details: findings of eval and HEP  Person educated: Patient Education method: Explanation, Demonstration, Tactile cues, Verbal cues, and Handouts Education comprehension: verbalized understanding, returned demonstration, verbal cues required, and needs further education    GOALS: Goals reviewed with patient? Yes  LONG TERM GOALS: Target date: 6 wks  Patient to be independent in home program to decreasing pain to less than 3/10 during the day.  And decrease stiffness in the morning.  Goal status: MET  2.  Patient to verbalize 3 modifications and joint protection to decrease stiffness and pain in right hand and wrist. Goal status:MET  3.  Right wrist active range of motion increased to within normal limits symptom-free for patient to push door open, turn doorknob number for bathing and dressing  Goal status: MET  4.  Right thumb active range of motion increased to  within normal limits symptom-free for patient to turn a doorknob, write and use mouse or computer  Goal status: MET  5.  Patient's right hand grip and prehension strength to be within normal limits for her age symptom-free to carry groceries, do painting, laundry making the bed and cook. Goal status: MET  ASSESSMENT:  CLINICAL IMPRESSION: Patient seen for occupational therapy evaluation for right carpal tunnel release.  Surgery was performed on 08/21/2022.  Pt arrive with reports of no pain anymore - no sensory issues.  Pt initiated and reviewed with patient ergonomics with computer use. Pt implemented modifications and strategies to her workspace and reporting decreased pain overall and demonstrates improved active range of motion in wrist, digits and hand.  Strength in R hand and wrist WNL - right hand grip and pinch  return to WNL - great progress overall- met all goals and discharge to use paraffin at home and cont with ROM PERFORMANCE DEFICITS: in functional skills including ADLs, IADLs, ROM, strength, pain, flexibility, decreased knowledge of use of DME, and UE functional use,   and psychosocial skills including environmental adaptation and routines and behaviors.   IMPAIRMENTS: are limiting patient from ADLs, IADLs, rest and sleep, play, leisure, and social participation.   COMORBIDITIES: has no other co-morbidities that affects occupational performance. Patient will benefit from skilled OT to address above impairments and improve overall function.  MODIFICATION OR ASSISTANCE TO COMPLETE EVALUATION: No modification of tasks or  assist necessary to complete an evaluation.  OT OCCUPATIONAL PROFILE AND HISTORY: Problem focused assessment: Including review of records relating to presenting problem.  CLINICAL DECISION MAKING: LOW - limited treatment options, no task modification necessary  REHAB POTENTIAL: Good for goals  EVALUATION COMPLEXITY: Low   PLAN:  OT FREQUENCY: 1  wk OT  DURATION: 1 wk  PLANNED INTERVENTIONS: 97535 self care/ADL training, 02889 therapeutic exercise, 97140 manual therapy, 97035 ultrasound, 97018 paraffin, 02960 fluidotherapy, 97034 contrast bath, 97760 Orthotics management and training, scar mobilization, passive range of motion, patient/family education, and DME and/or AE instructions CONSULTED AND AGREED WITH PLAN OF CARE: Patient   Ancel Peters, OTR/L,CLT 03/13/2023, 8:42 AM

## 2023-03-21 ENCOUNTER — Other Ambulatory Visit: Payer: Self-pay

## 2023-03-21 MED ORDER — CARBIDOPA-LEVODOPA 25-100 MG PO TABS
1.0000 | ORAL_TABLET | Freq: Three times a day (TID) | ORAL | 3 refills | Status: DC
  Filled 2023-03-21: qty 90, 30d supply, fill #0
  Filled 2023-05-17: qty 90, 30d supply, fill #1

## 2023-04-04 ENCOUNTER — Ambulatory Visit
Admission: RE | Admit: 2023-04-04 | Discharge: 2023-04-04 | Disposition: A | Discharge status: Home / Self Care | Payer: Managed Care, Other (non HMO) | Source: Ambulatory Visit | Attending: Internal Medicine | Admitting: Internal Medicine

## 2023-04-04 ENCOUNTER — Other Ambulatory Visit: Payer: Self-pay | Admitting: Internal Medicine

## 2023-04-04 DIAGNOSIS — Z1231 Encounter for screening mammogram for malignant neoplasm of breast: Secondary | ICD-10-CM | POA: Insufficient documentation

## 2023-04-16 ENCOUNTER — Other Ambulatory Visit: Payer: Self-pay

## 2023-04-17 ENCOUNTER — Other Ambulatory Visit: Payer: Self-pay

## 2023-04-24 ENCOUNTER — Other Ambulatory Visit: Payer: Self-pay

## 2023-04-24 MED ORDER — TIZANIDINE HCL 4 MG PO TABS
4.0000 mg | ORAL_TABLET | Freq: Three times a day (TID) | ORAL | 0 refills | Status: AC | PRN
  Filled 2023-04-24: qty 30, 10d supply, fill #0

## 2023-05-14 ENCOUNTER — Other Ambulatory Visit: Payer: Self-pay

## 2023-05-14 MED ORDER — MELOXICAM 15 MG PO TABS
15.0000 mg | ORAL_TABLET | Freq: Every day | ORAL | 0 refills | Status: DC | PRN
  Filled 2023-05-14: qty 14, 14d supply, fill #0

## 2023-05-14 MED ORDER — HYDROCODONE-ACETAMINOPHEN 5-325 MG PO TABS
1.0000 | ORAL_TABLET | Freq: Four times a day (QID) | ORAL | 0 refills | Status: DC | PRN
  Filled 2023-05-14: qty 20, 5d supply, fill #0

## 2023-05-14 MED ORDER — CYCLOBENZAPRINE HCL 5 MG PO TABS
5.0000 mg | ORAL_TABLET | Freq: Three times a day (TID) | ORAL | 0 refills | Status: DC | PRN
  Filled 2023-05-14: qty 20, 7d supply, fill #0

## 2023-05-17 ENCOUNTER — Other Ambulatory Visit: Payer: Self-pay

## 2023-05-25 ENCOUNTER — Other Ambulatory Visit: Payer: Self-pay

## 2023-05-25 MED ORDER — METAXALONE 800 MG PO TABS
400.0000 mg | ORAL_TABLET | Freq: Three times a day (TID) | ORAL | 0 refills | Status: DC | PRN
  Filled 2023-05-25: qty 20, 7d supply, fill #0

## 2023-05-25 MED ORDER — MELOXICAM 15 MG PO TABS
15.0000 mg | ORAL_TABLET | Freq: Every day | ORAL | 1 refills | Status: DC
  Filled 2023-05-25: qty 30, 30d supply, fill #0
  Filled 2023-06-25: qty 30, 30d supply, fill #1

## 2023-05-28 ENCOUNTER — Other Ambulatory Visit: Payer: Self-pay

## 2023-05-28 MED ORDER — PREDNISONE 10 MG PO TABS
ORAL_TABLET | ORAL | 0 refills | Status: DC
  Filled 2023-05-28: qty 21, 6d supply, fill #0

## 2023-06-25 ENCOUNTER — Other Ambulatory Visit: Payer: Self-pay

## 2023-07-26 ENCOUNTER — Other Ambulatory Visit: Payer: Self-pay

## 2023-07-26 MED ORDER — ETODOLAC 400 MG PO TABS
400.0000 mg | ORAL_TABLET | Freq: Two times a day (BID) | ORAL | 1 refills | Status: DC
  Filled 2023-07-26: qty 180, 90d supply, fill #0

## 2023-07-26 MED ORDER — ARIPIPRAZOLE 5 MG PO TABS
5.0000 mg | ORAL_TABLET | Freq: Every day | ORAL | 0 refills | Status: DC
  Filled 2023-07-26: qty 30, 30d supply, fill #0

## 2023-08-06 ENCOUNTER — Other Ambulatory Visit: Payer: Self-pay

## 2023-08-06 MED ORDER — LAMOTRIGINE 100 MG PO TABS
100.0000 mg | ORAL_TABLET | Freq: Two times a day (BID) | ORAL | 11 refills | Status: DC
  Filled 2023-08-06: qty 60, 30d supply, fill #0

## 2023-08-15 IMAGING — US US EXTREM LOW*R* LIMITED
1 series · 14 of 14 positions shown · non-contrast
Comparison: None

CLINICAL DATA: Inguinal adenopathy detected [REDACTED], RIGHT
inguinal pain

EXAM:
ULTRASOUND RIGHT LOWER EXTREMITY LIMITED
TECHNIQUE: Ultrasound examination of the lower extremity soft tissues was
performed in the area of clinical concern.

[Series 1: us soft tissue lower extremity limited right (non- · 14 of 14 slices shown]
[im 1/14]
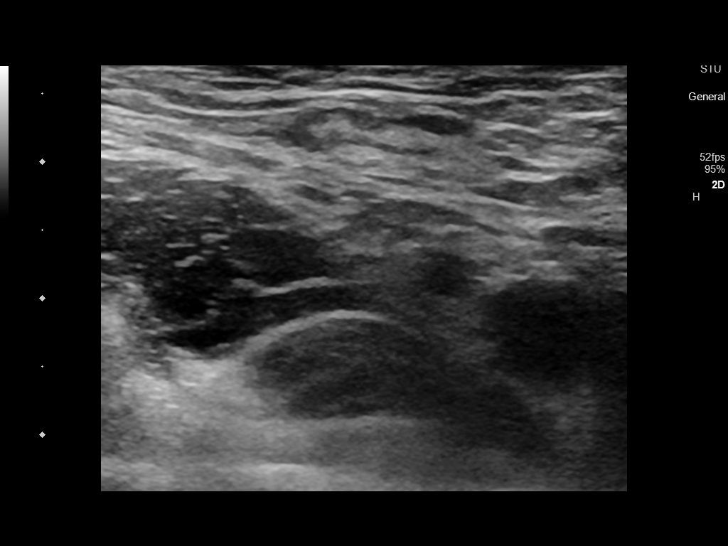
[im 2/14]
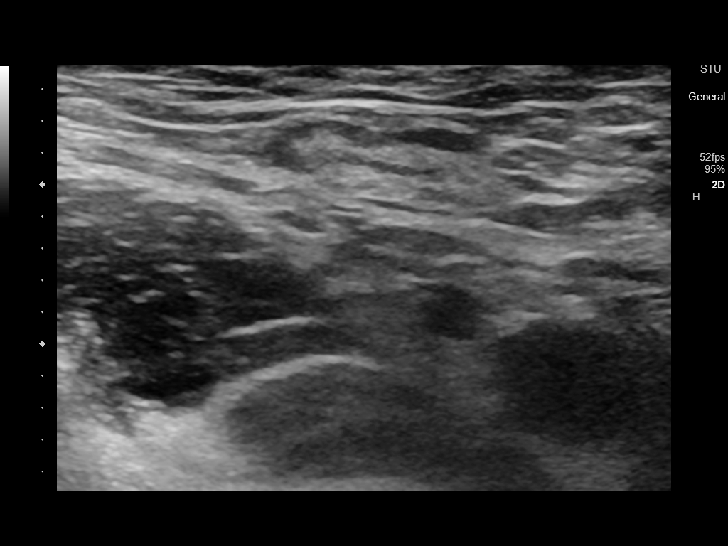
[im 3/14]
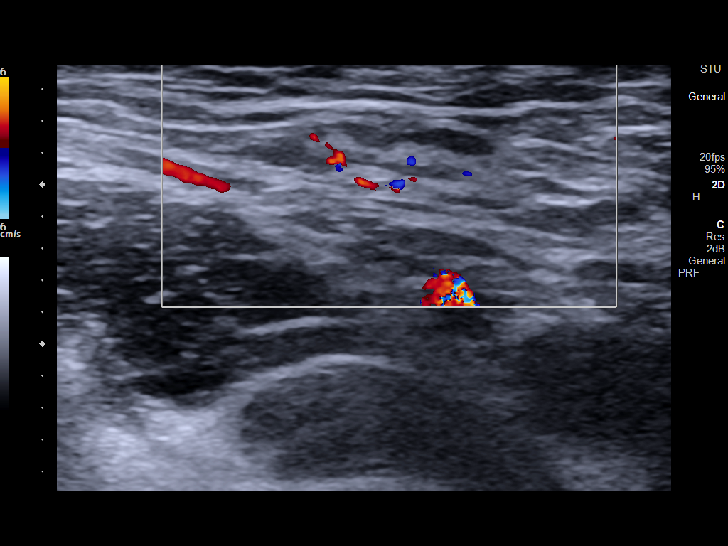
[im 4/14]
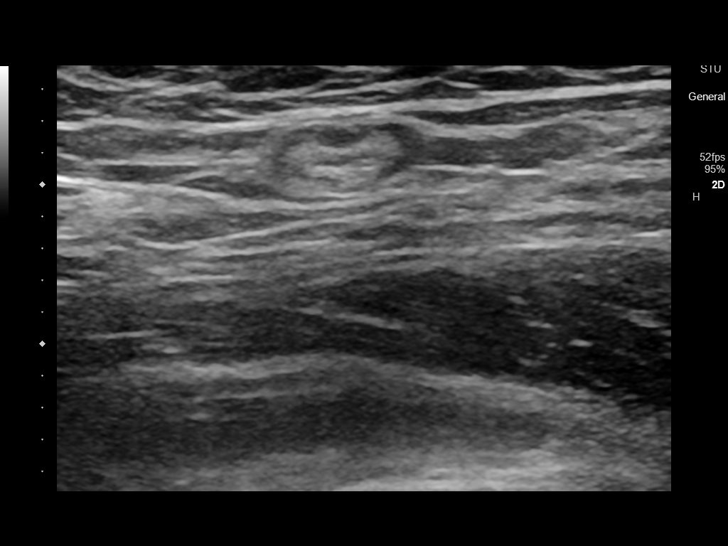
[im 5/14]
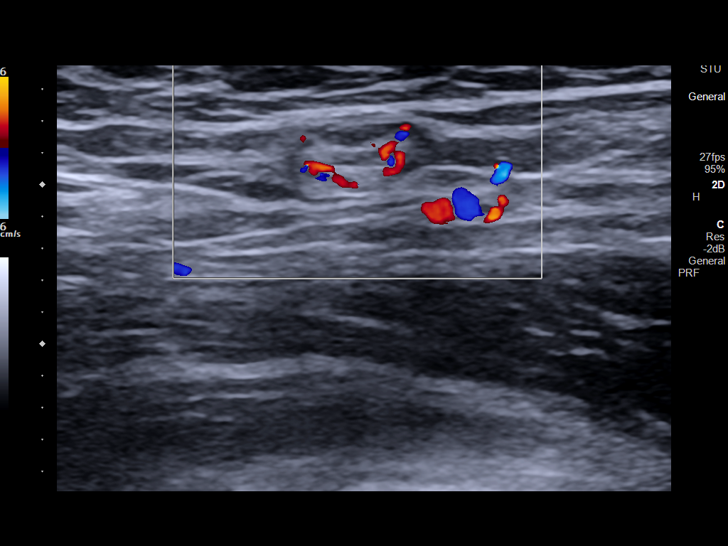
[im 6/14]
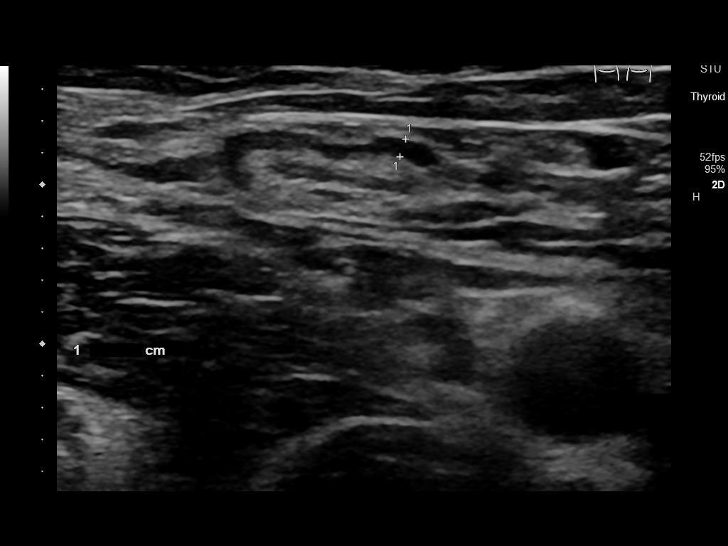
[im 7/14]
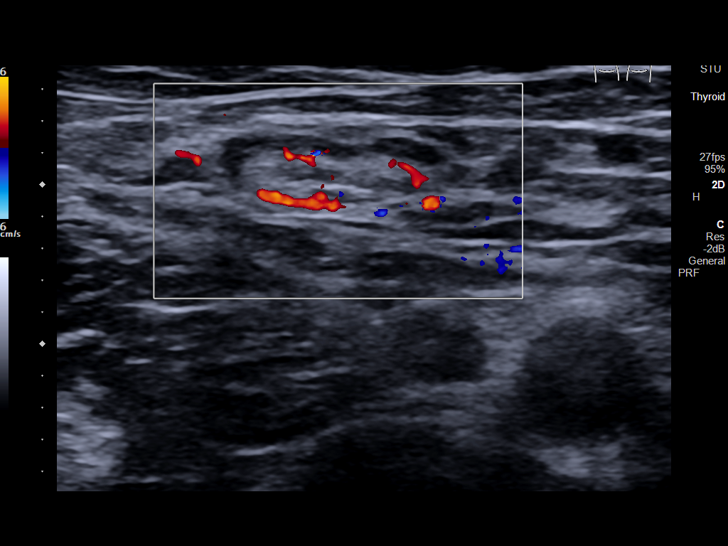
[im 8/14]
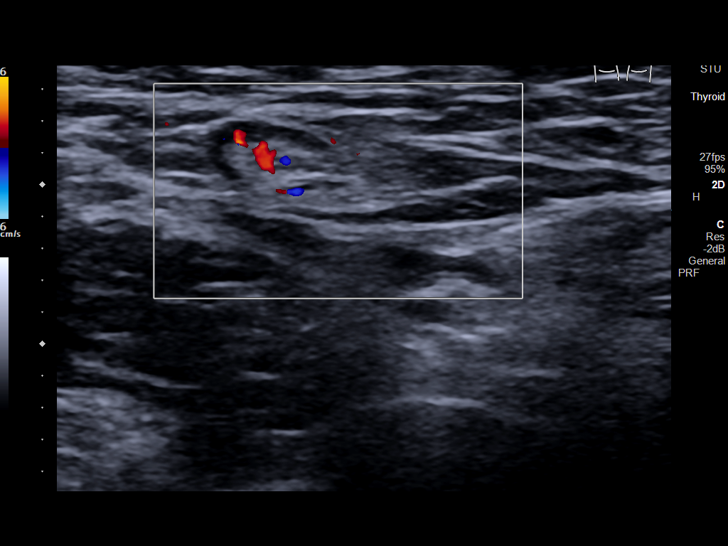
[im 9/14]
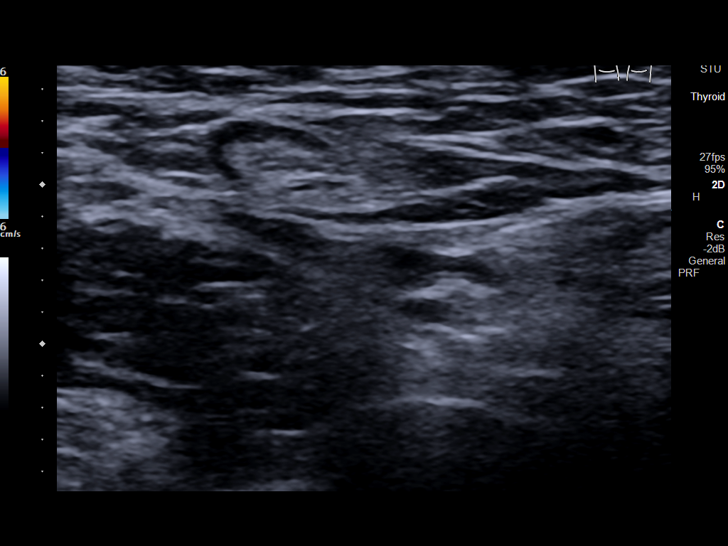
[im 10/14]
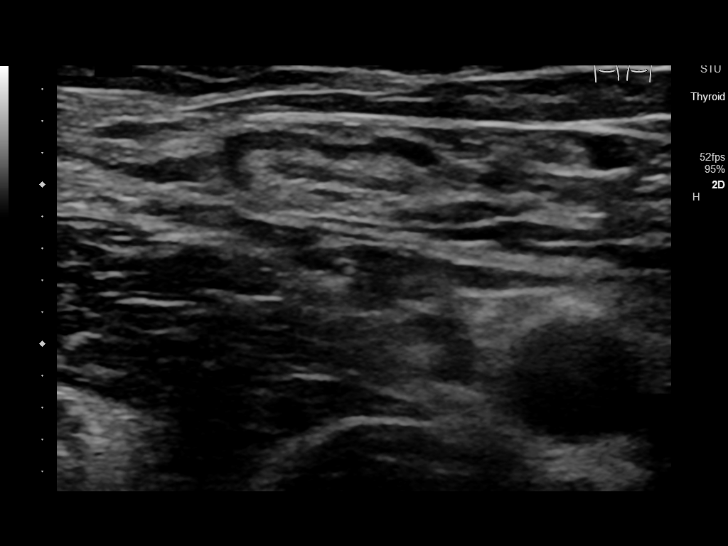
[im 11/14]
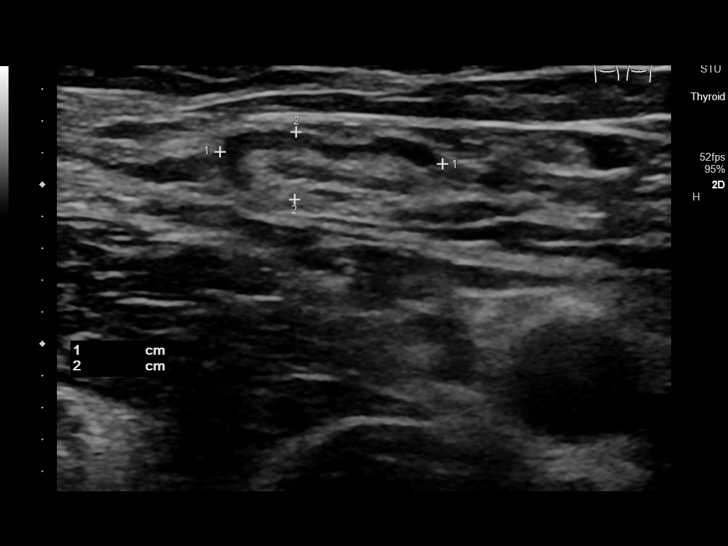
[im 12/14]
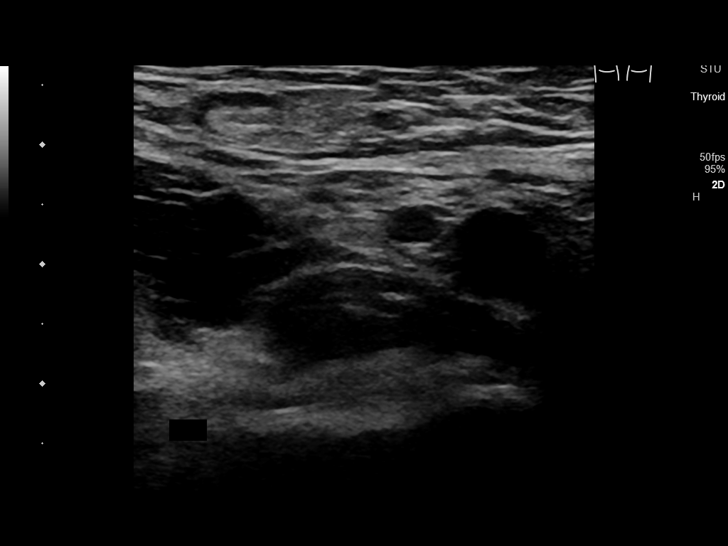
[im 13/14]
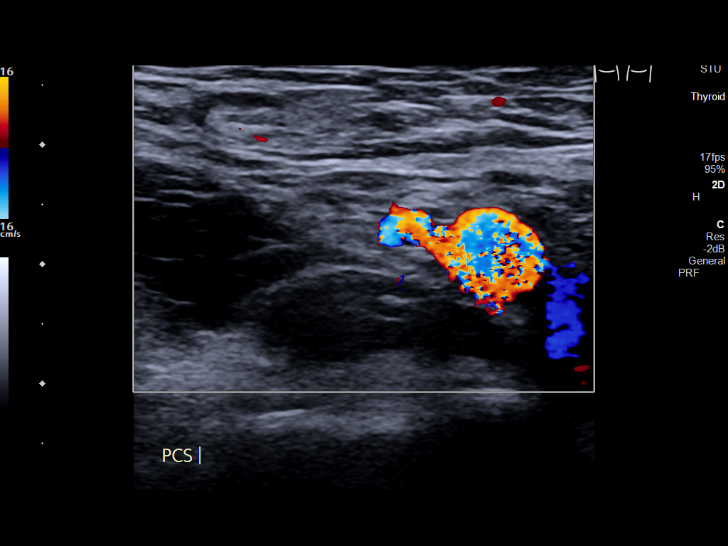
[im 14/14]
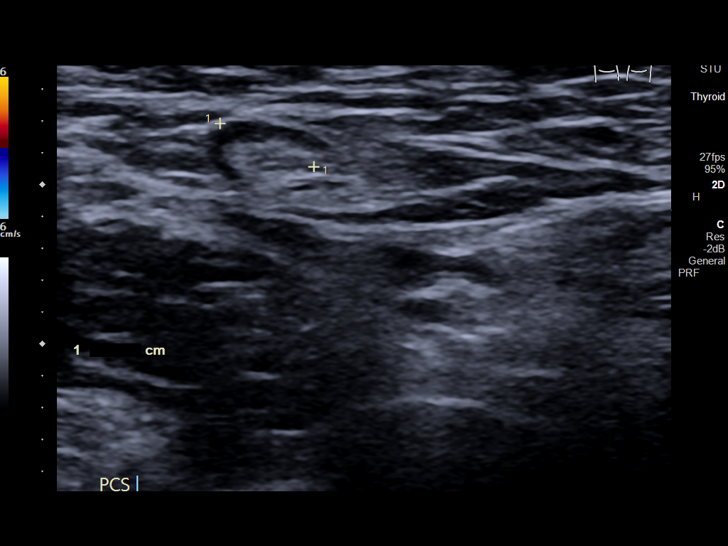

[14 of 14 positions shown; findings below may reference images not displayed]

FINDINGS: Normal sized RIGHT inguinal lymph nodes noted.

No enlarged or abnormal appearing RIGHT inguinal lymph nodes.

No hernia, mass or abnormal fluid collection.
IMPRESSION: Normal sized RIGHT inguinal lymph nodes.

Otherwise negative exam.

## 2023-08-20 ENCOUNTER — Other Ambulatory Visit: Payer: Self-pay

## 2023-08-20 MED ORDER — VALACYCLOVIR HCL 1 G PO TABS
1.0000 g | ORAL_TABLET | Freq: Three times a day (TID) | ORAL | 0 refills | Status: DC
  Filled 2023-08-20: qty 30, 10d supply, fill #0

## 2023-08-24 ENCOUNTER — Other Ambulatory Visit: Payer: Self-pay

## 2023-08-24 MED ORDER — SERTRALINE HCL 50 MG PO TABS
50.0000 mg | ORAL_TABLET | Freq: Every day | ORAL | 0 refills | Status: DC
  Filled 2023-08-24: qty 30, 30d supply, fill #0

## 2023-08-24 MED ORDER — TRAZODONE HCL 50 MG PO TABS
50.0000 mg | ORAL_TABLET | Freq: Every evening | ORAL | 0 refills | Status: AC
  Filled 2023-08-24: qty 90, 90d supply, fill #0

## 2023-08-24 MED ORDER — VENLAFAXINE HCL ER 150 MG PO CP24
150.0000 mg | ORAL_CAPSULE | Freq: Every day | ORAL | 0 refills | Status: AC
  Filled 2023-08-24: qty 90, 90d supply, fill #0

## 2023-08-27 ENCOUNTER — Other Ambulatory Visit: Payer: Self-pay

## 2023-08-28 ENCOUNTER — Other Ambulatory Visit: Payer: Self-pay

## 2023-08-28 MED ORDER — PREDNISONE 10 MG PO TABS
ORAL_TABLET | ORAL | 0 refills | Status: AC
  Filled 2023-08-28: qty 21, 6d supply, fill #0

## 2023-08-28 MED ORDER — HYDROXYZINE HCL 25 MG PO TABS
25.0000 mg | ORAL_TABLET | Freq: Three times a day (TID) | ORAL | 0 refills | Status: AC
  Filled 2023-08-28: qty 30, 10d supply, fill #0

## 2023-08-30 ENCOUNTER — Other Ambulatory Visit: Payer: Self-pay

## 2023-08-30 MED ORDER — HYDROXYZINE HCL 25 MG PO TABS
50.0000 mg | ORAL_TABLET | Freq: Three times a day (TID) | ORAL | 0 refills | Status: AC | PRN
  Filled 2023-08-30: qty 2, 1d supply, fill #0
  Filled 2023-08-30: qty 48, 8d supply, fill #0

## 2023-08-30 MED ORDER — PREDNISONE 20 MG PO TABS
ORAL_TABLET | ORAL | 0 refills | Status: DC
  Filled 2023-08-30: qty 18, 9d supply, fill #0

## 2023-09-06 ENCOUNTER — Encounter: Payer: Self-pay | Admitting: Dermatology

## 2023-09-06 ENCOUNTER — Ambulatory Visit: Admitting: Dermatology

## 2023-09-06 ENCOUNTER — Other Ambulatory Visit: Payer: Self-pay

## 2023-09-06 DIAGNOSIS — R21 Rash and other nonspecific skin eruption: Secondary | ICD-10-CM

## 2023-09-06 DIAGNOSIS — T50905A Adverse effect of unspecified drugs, medicaments and biological substances, initial encounter: Secondary | ICD-10-CM

## 2023-09-06 MED ORDER — TRIAMCINOLONE ACETONIDE 0.1 % EX CREA
1.0000 | TOPICAL_CREAM | Freq: Two times a day (BID) | CUTANEOUS | 0 refills | Status: AC
  Filled 2023-09-06: qty 454, 90d supply, fill #0

## 2023-09-06 NOTE — Patient Instructions (Signed)

## 2023-09-06 NOTE — Progress Notes (Signed)
   Follow-Up Visit   Subjective  Ann Jimenez is a 63 y.o. female who presents for the following: Rash all over, rash started 06/16, itchy, has had some swelling with hands and feet, pt did have some blurred vision, weakness in legs for one day but has since improved, pt was put on Lamictal  (Physiatrist thinks to aggressive start), pt had also started Zoloft  on same day as rash started but only took 1 dose and had taken in past with no issues Prednisone  5d taper, then did not improve so currently on a Prednisone  9d taper, Currently taking Atarax  50mg  qam and 50mg  x 2 qpm, tried otc HC, no pain or peeling inside mouth   The following portions of the chart were reviewed this encounter and updated as appropriate: medications, allergies, medical history  Review of Systems:  No other skin or systemic complaints except as noted in HPI or Assessment and Plan.  Objective  Well appearing patient in no apparent distress; mood and affect are within normal limits.   A focused examination was performed of the following areas: Face, arms, trunk, arms, legs, oral cavity  Relevant exam findings are noted in the Assessment and Plan.                            Assessment & Plan   Rash Trunk, extremities Labs from 07/26/23 and 08/23/23 viewed Exam: erythematous pinpoint papules on bilateral dorsal feet. Erythematous blanchable macules and patches on back and arms. No erosions in mouth nares eyes. Patient denies genital discomfort  Differential diagnosis:  Morbiliform drug reaction Likely Secondary to Lamictal   Treatment Plan: No evidence of SJS today Discussed Stevens-Pottle syndrome and symptoms, discussed if she develops those symptoms to go to [facility] ED for dermatology service and [facility] Start TMC 0.1% cr qd/bid aa rash on body until symptoms resolve, avoid face, groin, axilla Recommend compression socks for edema Repeat labs to check if transaminitis and  hypokalemia resolved, and recheck for anemia leukopenia Added lamotrigine  to allergy list  Topical steroids (such as triamcinolone, fluocinolone, fluocinonide, mometasone, clobetasol, halobetasol, betamethasone, hydrocortisone) can cause thinning and lightening of the skin if they are used for too long in the same area. Your physician has selected the right strength medicine for your problem and area affected on the body. Please use your medication only as directed by your physician to prevent side effects.   ADVERSE EFFECT OF DRUG, INITIAL ENCOUNTER   Related Procedures CBC with Differential/Platelets CMP RASH    Return in about 2 weeks (around 09/20/2023) for Rash f/u.  I, Grayce Saunas, RMA, am acting as scribe for Boneta Sharps, MD .   Documentation: I have reviewed the above documentation for accuracy and completeness, and I agree with the above.  Boneta Sharps, MD

## 2023-09-18 ENCOUNTER — Ambulatory Visit: Admitting: Psychiatry

## 2023-09-20 ENCOUNTER — Ambulatory Visit: Admitting: Dermatology

## 2023-09-25 ENCOUNTER — Other Ambulatory Visit: Payer: Self-pay

## 2023-09-25 MED ORDER — TRAZODONE HCL 50 MG PO TABS
50.0000 mg | ORAL_TABLET | Freq: Every day | ORAL | 0 refills | Status: AC
  Filled 2023-09-25: qty 90, 90d supply, fill #0

## 2023-09-25 MED ORDER — VENLAFAXINE HCL ER 150 MG PO CP24
150.0000 mg | ORAL_CAPSULE | Freq: Every day | ORAL | 0 refills | Status: AC
  Filled 2023-09-25: qty 90, 90d supply, fill #0

## 2023-09-25 MED ORDER — SERTRALINE HCL 50 MG PO TABS
50.0000 mg | ORAL_TABLET | Freq: Every day | ORAL | 0 refills | Status: AC
  Filled 2023-09-25: qty 90, 90d supply, fill #0

## 2023-12-08 ENCOUNTER — Other Ambulatory Visit: Payer: Self-pay

## 2023-12-09 ENCOUNTER — Other Ambulatory Visit: Payer: Self-pay

## 2023-12-09 MED ORDER — GABAPENTIN 300 MG PO CAPS
600.0000 mg | ORAL_CAPSULE | Freq: Two times a day (BID) | ORAL | 11 refills | Status: AC
  Filled 2023-12-09: qty 120, 30d supply, fill #0
  Filled 2024-01-10: qty 120, 30d supply, fill #1

## 2023-12-10 ENCOUNTER — Other Ambulatory Visit: Payer: Self-pay

## 2023-12-19 ENCOUNTER — Other Ambulatory Visit: Payer: Self-pay

## 2023-12-20 ENCOUNTER — Other Ambulatory Visit: Payer: Self-pay

## 2023-12-25 ENCOUNTER — Other Ambulatory Visit: Payer: Self-pay

## 2023-12-25 MED ORDER — VENLAFAXINE HCL ER 150 MG PO CP24
150.0000 mg | ORAL_CAPSULE | Freq: Every day | ORAL | 0 refills | Status: AC
  Filled 2023-12-25: qty 90, 90d supply, fill #0

## 2023-12-25 MED ORDER — TRAZODONE HCL 50 MG PO TABS
50.0000 mg | ORAL_TABLET | Freq: Every day | ORAL | 0 refills | Status: AC
  Filled 2023-12-25: qty 90, 90d supply, fill #0

## 2023-12-25 MED ORDER — SERTRALINE HCL 50 MG PO TABS
50.0000 mg | ORAL_TABLET | Freq: Every morning | ORAL | 0 refills | Status: AC
  Filled 2023-12-25: qty 90, 90d supply, fill #0

## 2024-02-25 ENCOUNTER — Other Ambulatory Visit: Payer: Self-pay | Admitting: Internal Medicine

## 2024-02-25 DIAGNOSIS — Z1231 Encounter for screening mammogram for malignant neoplasm of breast: Secondary | ICD-10-CM

## 2024-02-26 ENCOUNTER — Other Ambulatory Visit: Payer: Self-pay

## 2024-03-29 ENCOUNTER — Other Ambulatory Visit: Payer: Self-pay | Admitting: Medical Genetics

## 2024-03-31 ENCOUNTER — Other Ambulatory Visit

## 2024-04-04 ENCOUNTER — Ambulatory Visit
Admission: RE | Admit: 2024-04-04 | Discharge: 2024-04-04 | Disposition: A | Payer: Self-pay | Source: Ambulatory Visit | Attending: Internal Medicine | Admitting: Internal Medicine

## 2024-04-04 ENCOUNTER — Ambulatory Visit
Admission: RE | Admit: 2024-04-04 | Discharge: 2024-04-04 | Disposition: A | Payer: Self-pay | Source: Ambulatory Visit | Attending: Medical Genetics | Admitting: Medical Genetics

## 2024-04-04 DIAGNOSIS — Z1231 Encounter for screening mammogram for malignant neoplasm of breast: Secondary | ICD-10-CM | POA: Diagnosis present
